# Patient Record
Sex: Male | Born: 1965 | Race: White | Hispanic: No | Marital: Married | Smoking: Current every day smoker
Health system: Southern US, Community
[De-identification: ages and names within clinical notes are randomized; demographics above are authoritative.]

## PROBLEM LIST (undated history)

## (undated) DIAGNOSIS — F29 Unspecified psychosis not due to a substance or known physiological condition: Secondary | ICD-10-CM

## (undated) DIAGNOSIS — F419 Anxiety disorder, unspecified: Secondary | ICD-10-CM

## (undated) DIAGNOSIS — J45909 Unspecified asthma, uncomplicated: Secondary | ICD-10-CM

## (undated) HISTORY — PX: HERNIA REPAIR: SHX51

---

## 2008-04-09 ENCOUNTER — Emergency Department (HOSPITAL_COMMUNITY): Admission: EM | Admit: 2008-04-09 | Discharge: 2008-04-09 | Payer: Self-pay | Admitting: Emergency Medicine

## 2017-09-22 ENCOUNTER — Encounter: Payer: Self-pay | Admitting: Emergency Medicine

## 2017-09-22 ENCOUNTER — Observation Stay
Admission: EM | Admit: 2017-09-22 | Discharge: 2017-09-24 | Disposition: A | Discharge status: Home / Self Care | Payer: BLUE CROSS/BLUE SHIELD | Attending: Internal Medicine | Admitting: Internal Medicine

## 2017-09-22 ENCOUNTER — Emergency Department: Payer: BLUE CROSS/BLUE SHIELD

## 2017-09-22 DIAGNOSIS — Z8249 Family history of ischemic heart disease and other diseases of the circulatory system: Secondary | ICD-10-CM | POA: Diagnosis not present

## 2017-09-22 DIAGNOSIS — S0511XA Contusion of eyeball and orbital tissues, right eye, initial encounter: Secondary | ICD-10-CM

## 2017-09-22 DIAGNOSIS — F419 Anxiety disorder, unspecified: Secondary | ICD-10-CM | POA: Insufficient documentation

## 2017-09-22 DIAGNOSIS — R569 Unspecified convulsions: Principal | ICD-10-CM | POA: Insufficient documentation

## 2017-09-22 DIAGNOSIS — S01512A Laceration without foreign body of oral cavity, initial encounter: Secondary | ICD-10-CM | POA: Diagnosis not present

## 2017-09-22 DIAGNOSIS — R55 Syncope and collapse: Secondary | ICD-10-CM | POA: Diagnosis present

## 2017-09-22 DIAGNOSIS — W1839XA Other fall on same level, initial encounter: Secondary | ICD-10-CM | POA: Insufficient documentation

## 2017-09-22 DIAGNOSIS — E785 Hyperlipidemia, unspecified: Secondary | ICD-10-CM | POA: Diagnosis not present

## 2017-09-22 DIAGNOSIS — K219 Gastro-esophageal reflux disease without esophagitis: Secondary | ICD-10-CM | POA: Insufficient documentation

## 2017-09-22 DIAGNOSIS — F29 Unspecified psychosis not due to a substance or known physiological condition: Secondary | ICD-10-CM | POA: Insufficient documentation

## 2017-09-22 DIAGNOSIS — F329 Major depressive disorder, single episode, unspecified: Secondary | ICD-10-CM | POA: Diagnosis not present

## 2017-09-22 DIAGNOSIS — J45909 Unspecified asthma, uncomplicated: Secondary | ICD-10-CM | POA: Diagnosis not present

## 2017-09-22 DIAGNOSIS — F17290 Nicotine dependence, other tobacco product, uncomplicated: Secondary | ICD-10-CM | POA: Diagnosis not present

## 2017-09-22 HISTORY — DX: Unspecified psychosis not due to a substance or known physiological condition: F29

## 2017-09-22 HISTORY — DX: Anxiety disorder, unspecified: F41.9

## 2017-09-22 HISTORY — DX: Unspecified asthma, uncomplicated: J45.909

## 2017-09-22 LAB — BASIC METABOLIC PANEL
Anion gap: 14 (ref 5–15)
BUN: 7 mg/dL (ref 6–20)
CO2: 20 mmol/L — AB (ref 22–32)
Calcium: 8.5 mg/dL — ABNORMAL LOW (ref 8.9–10.3)
Chloride: 104 mmol/L (ref 101–111)
Creatinine, Ser: 1.04 mg/dL (ref 0.61–1.24)
GFR calc Af Amer: >60 mL/min (ref >60)
GFR calc non Af Amer: >60 mL/min (ref >60)
GLUCOSE: 78 mg/dL (ref 65–99)
POTASSIUM: 4.2 mmol/L (ref 3.5–5.1)
Sodium: 138 mmol/L (ref 135–145)

## 2017-09-22 LAB — URINALYSIS, COMPLETE (UACMP) WITH MICROSCOPIC
BACTERIA UA: NONE SEEN
Bilirubin Urine: NEGATIVE
Glucose, UA: NEGATIVE mg/dL
HGB URINE DIPSTICK: NEGATIVE
KETONES UR: NEGATIVE mg/dL
Leukocytes, UA: NEGATIVE
NITRITE: NEGATIVE
PROTEIN: NEGATIVE mg/dL
Specific Gravity, Urine: 1.006 (ref 1.005–1.030)
Squamous Epithelial / LPF: NONE SEEN
pH: 7 (ref 5.0–8.0)

## 2017-09-22 LAB — CBC
HEMATOCRIT: 43.8 % (ref 40.0–52.0)
HEMOGLOBIN: 15.2 g/dL (ref 13.0–18.0)
MCH: 30.5 pg (ref 26.0–34.0)
MCHC: 34.7 g/dL (ref 32.0–36.0)
MCV: 87.9 fL (ref 80.0–100.0)
Platelets: 243 10*3/uL (ref 150–440)
RBC: 4.98 MIL/uL (ref 4.40–5.90)
RDW: 13.4 % (ref 11.5–14.5)
WBC: 6.6 10*3/uL (ref 3.8–10.6)

## 2017-09-22 LAB — GLUCOSE, CAPILLARY: GLUCOSE-CAPILLARY: 70 mg/dL (ref 65–99)

## 2017-09-22 LAB — ETHANOL

## 2017-09-22 LAB — HEPATIC FUNCTION PANEL
ALBUMIN: 3.9 g/dL (ref 3.5–5.0)
ALT: 14 U/L — ABNORMAL LOW (ref 17–63)
AST: 36 U/L (ref 15–41)
Alkaline Phosphatase: 53 U/L (ref 38–126)
BILIRUBIN DIRECT: 0.2 mg/dL (ref 0.1–0.5)
BILIRUBIN INDIRECT: 0.6 mg/dL (ref 0.3–0.9)
BILIRUBIN TOTAL: 0.8 mg/dL (ref 0.3–1.2)
Total Protein: 6.8 g/dL (ref 6.5–8.1)

## 2017-09-22 LAB — URINE DRUG SCREEN, QUALITATIVE (ARMC ONLY)
AMPHETAMINES, UR SCREEN: NOT DETECTED
BARBITURATES, UR SCREEN: NOT DETECTED
BENZODIAZEPINE, UR SCRN: NOT DETECTED
CANNABINOID 50 NG, UR ~~LOC~~: NOT DETECTED
Cocaine Metabolite,Ur ~~LOC~~: NOT DETECTED
MDMA (Ecstasy)Ur Screen: NOT DETECTED
Methadone Scn, Ur: NOT DETECTED
Opiate, Ur Screen: NOT DETECTED
PHENCYCLIDINE (PCP) UR S: NOT DETECTED
Tricyclic, Ur Screen: NOT DETECTED

## 2017-09-22 LAB — TROPONIN I: Troponin I: <0.03 ng/mL (ref <0.03)

## 2017-09-22 MED ORDER — ONDANSETRON HCL 4 MG PO TABS: 4.0000 mg | ORAL_TABLET | Freq: Four times a day (QID) | ORAL | Status: DC | PRN

## 2017-09-22 MED ORDER — ACETAMINOPHEN 500 MG PO TABS
1000.0000 mg | ORAL_TABLET | Freq: Once | ORAL | Status: AC
  Administered 2017-09-22: 1000 mg via ORAL
  Filled 2017-09-22: qty 2

## 2017-09-22 MED ORDER — ATORVASTATIN CALCIUM 20 MG PO TABS
40.0000 mg | ORAL_TABLET | Freq: Every evening | ORAL | Status: DC
  Administered 2017-09-22 – 2017-09-23 (×2): 40 mg via ORAL
  Filled 2017-09-22 (×2): qty 2

## 2017-09-22 MED ORDER — HYDROXYZINE HCL 25 MG PO TABS
25.0000 mg | ORAL_TABLET | Freq: Four times a day (QID) | ORAL | Status: DC | PRN
  Filled 2017-09-22: qty 1

## 2017-09-22 MED ORDER — ONDANSETRON HCL 4 MG/2ML IJ SOLN: 4.0000 mg | Freq: Four times a day (QID) | INTRAMUSCULAR | Status: DC | PRN

## 2017-09-22 MED ORDER — ACETAMINOPHEN 325 MG PO TABS: 650.0000 mg | ORAL_TABLET | Freq: Four times a day (QID) | ORAL | Status: DC | PRN

## 2017-09-22 MED ORDER — SODIUM CHLORIDE 0.9 % IV BOLUS (SEPSIS)
1000.0000 mL | Freq: Once | INTRAVENOUS | Status: AC
  Administered 2017-09-22: 1000 mL via INTRAVENOUS

## 2017-09-22 MED ORDER — ACETAMINOPHEN 650 MG RE SUPP: 650.0000 mg | Freq: Four times a day (QID) | RECTAL | Status: DC | PRN

## 2017-09-22 MED ORDER — CITALOPRAM HYDROBROMIDE 20 MG PO TABS
30.0000 mg | ORAL_TABLET | Freq: Every day | ORAL | Status: DC
  Administered 2017-09-23 – 2017-09-24 (×2): 30 mg via ORAL
  Filled 2017-09-22 (×2): qty 2

## 2017-09-22 MED ORDER — QUETIAPINE FUMARATE 25 MG PO TABS
50.0000 mg | ORAL_TABLET | Freq: Every day | ORAL | Status: DC
  Administered 2017-09-23 – 2017-09-24 (×2): 50 mg via ORAL
  Filled 2017-09-22 (×2): qty 2

## 2017-09-22 MED ORDER — ENOXAPARIN SODIUM 40 MG/0.4ML ~~LOC~~ SOLN
40.0000 mg | SUBCUTANEOUS | Status: DC
  Filled 2017-09-22: qty 0.4

## 2017-09-22 MED ORDER — QUETIAPINE FUMARATE ER 200 MG PO TB24
400.0000 mg | ORAL_TABLET | Freq: Every day | ORAL | Status: DC
  Administered 2017-09-22 – 2017-09-23 (×2): 400 mg via ORAL
  Filled 2017-09-22: qty 1
  Filled 2017-09-22 (×3): qty 2

## 2017-09-22 MED ORDER — GABAPENTIN 600 MG PO TABS
900.0000 mg | ORAL_TABLET | Freq: Three times a day (TID) | ORAL | Status: DC
  Administered 2017-09-22 – 2017-09-24 (×5): 900 mg via ORAL
  Filled 2017-09-22 (×5): qty 2

## 2017-09-22 MED ORDER — FAMOTIDINE 20 MG PO TABS
20.0000 mg | ORAL_TABLET | Freq: Every day | ORAL | Status: DC
  Administered 2017-09-23 – 2017-09-24 (×2): 20 mg via ORAL
  Filled 2017-09-22 (×2): qty 1

## 2017-09-22 NOTE — ED Provider Notes (Signed)
[facility] Emergency Department Provider Note  ____________________________________________  Time seen: Approximately 3:49 PM  I have reviewed the triage vital signs and the nursing notes.   HISTORY  Chief Complaint Loss of Consciousness    HPI Scott West is a 51 y.o. male presenting with recurrent syncope.  The patient states that the only thing he had for breakfast was coffee and candy corn and he began to feel "shaky."  He was trying to order at [facility] when he collapsed and had tonic-clonic seizure-like activity, described by an off-duty paramedic who was at the scene.  He was postictal, with facial trauma and a right tongue laceration.  No urinary or fecal incontinence.  His blood sugar was normal, and his vital signs were stable.  He reports a similar episode 2 days ago, which he also attributed to not eating enough.  He has no history of seizures.  With both episodes, he had a postictal headache.  He denies any recent illness including cough or cold symptoms, nausea vomiting or diarrhea, fevers or chills.  No trauma prior to seizing.  He has had a recent change in his medications due to significant anxiety, including an increase in his Celexa.  The patient describes a remote history of alcoholism but has not had any alcohol since 2008; he denies any current illicit drug abuse.  Right now, the patient is having right shoulder discomfort.   Past Medical History:  Diagnosis Date  . Anxiety   . Asthma   . Psychosis (HCC)     There are no active problems to display for this patient.   Past Surgical History:  Procedure Laterality Date  . HERNIA REPAIR        Allergies Brethine [terbutaline] and Dimetapp decongestant [pseudoephedrine]  History reviewed. No pertinent family history.  Social History Social History  Substance Use Topics  . Smoking status: Current Every Day Smoker    Types: E-cigarettes  . Smokeless tobacco: Never Used  . Alcohol use  Yes     Comment: former 2007    Review of Systems Constitutional: No fever/chills.  No lightheadedness or dizziness.  Positive syncope with tonic-clonic seizure activity. Eyes: No visual changes.  No blurred or double vision.  Positive trauma to the right orbit. ENT: No sore throat. No congestion or rhinorrhea.  No dental injury or malocclusion.  Positive laceration to the right tongue. Cardiovascular: Denies chest pain. Denies palpitations. Respiratory: Denies shortness of breath.  No cough. Gastrointestinal: No abdominal pain.  No nausea, no vomiting.  No diarrhea.  No constipation. Genitourinary: Negative for dysuria. Musculoskeletal: Negative for back pain. Skin: Negative for rash. Neurological: Positive for headaches. No focal numbness, tingling or weakness.  Positive syncope with tonic-clonic activity.   Psychiatric:No current alcohol or drug abuse. Endocrine:Normal blood sugar.    ____________________________________________   PHYSICAL EXAM:  VITAL SIGNS: ED Triage Vitals  Enc Vitals Group     BP 09/22/17 1414 (!) 141/92     Pulse Rate 09/22/17 1414 93     Resp 09/22/17 1414 18     Temp 09/22/17 1414 98.1 F (36.7 C)     Temp src --      SpO2 09/22/17 1414 93 %     Weight 09/22/17 1414 215 lb (97.5 kg)     Height 09/22/17 1414 5\' 9"  (1.753 m)     Head Circumference --      Peak Flow --      Pain Score 09/22/17 1413 3  Pain Loc --      Pain Edu? --      Excl. in GC? --     Constitutional: Patient is alert and oriented on my examination.  His GCS is 15.  He is comfortable appearing. Eyes: Conjunctivae are normal.  EOMI. PERRLA.  Positive raccoon eye on the right.  No evidence of entrapment.  No scleral icterus. Head: Atraumatic.  No battle sign. Nose: No congestion/rhinnorhea. Mouth/Throat: Mucous membranes are moist.  No dental injury or malocclusion.  The patient does have a superficial abrasion to the right aspect of the tongue that is hemostatic at this  time. Neck: No stridor.  Supple.  No midline C-spine tenderness to palpation, step-offs or deformities.  Full range of motion without pain.  No meningismus. Cardiovascular: Normal rate, regular rhythm. No murmurs, rubs or gallops.  Respiratory: Normal respiratory effort.  No accessory muscle use or retractions. Lungs CTAB.  No wheezes, rales or ronchi. Gastrointestinal: Obese.  Soft, nontender and nondistended.  No guarding or rebound.  No peritoneal signs. Musculoskeletal: Pelvis is stable.  The patient has full range of motion of the bilateral wrists, elbows, and shoulders although he has some mild discomfort with movement of the right shoulder.  He has full range of motion without pain of the bilateral hips, knees, and ankles.  He has normal DP and PT pulses.  No LE edema. No ttp in the calves or palpable cords.  Negative Homan's sign. Neurologic:  A&Ox3.  Speech is clear.  Face and smile are symmetric.  EOMI.  Moves all extremities well. Skin:  Skin is warm, dry. No rash noted.  Mild linear abrasion that is superficial and approximately 18 inches long on the left back.  Superficial abrasion to the right hand that is 1 x 1 cm; local area of scabbing right next to that. Psychiatric: Mood and affect are normal. Speech and behavior are normal.  Normal judgement.  ____________________________________________   LABS (all labs ordered are listed, but only abnormal results are displayed)  Labs Reviewed  BASIC METABOLIC PANEL - Abnormal; Notable for the following:       Result Value   CO2 20 (*)    Calcium 8.5 (*)    All other components within normal limits  URINALYSIS, COMPLETE (UACMP) WITH MICROSCOPIC - Abnormal; Notable for the following:    Color, Urine STRAW (*)    APPearance CLEAR (*)    All other components within normal limits  HEPATIC FUNCTION PANEL - Abnormal; Notable for the following:    ALT 14 (*)    All other components within normal limits  CBC  GLUCOSE, CAPILLARY  URINE DRUG  SCREEN, QUALITATIVE ([facility] ONLY)  ETHANOL  TROPONIN I  CBG MONITORING, ED   ____________________________________________  EKG  ED ECG REPORT I, Rockne Menghini, the attending physician, personally viewed and interpreted this ECG.   Date: 09/22/2017  EKG Time: 1417  Rate: 93  Rhythm: normal sinus rhythm  Axis: normal  Intervals:none  ST&T Change: No STEMI.  No evidence of prolonged QTC.  No evidence of hypertrophy. No Brugada syndrome.  ____________________________________________  RADIOLOGY  Ct Head Wo Contrast  Result Date: 09/22/2017 CLINICAL DATA:  Seizure. EXAM: CT HEAD WITHOUT CONTRAST CT MAXILLOFACIAL WITHOUT CONTRAST TECHNIQUE: Multidetector CT imaging of the head and maxillofacial structures were performed using the standard protocol without intravenous contrast. Multiplanar CT image reconstructions of the maxillofacial structures were also generated. COMPARISON:  None. FINDINGS: CT HEAD FINDINGS Brain: No evidence of acute infarction, hemorrhage, hydrocephalus,  extra-axial collection or mass lesion/mass effect. Vascular: No hyperdense vessel or unexpected calcification. Skull: Normal. Negative for fracture or focal lesion. Other: None. CT MAXILLOFACIAL FINDINGS Osseous: No fracture or mandibular dislocation. No destructive process. Orbits: Negative. No traumatic or inflammatory finding. Sinuses: Clear. Soft tissues: Negative. IMPRESSION: 1. No acute intracranial abnormality. 2. No facial bone fractures. Electronically Signed   By: Gerome Sam III M.D   On: 09/22/2017 16:32   Ct Maxillofacial Wo Contrast  Result Date: 09/22/2017 CLINICAL DATA:  Seizure. EXAM: CT HEAD WITHOUT CONTRAST CT MAXILLOFACIAL WITHOUT CONTRAST TECHNIQUE: Multidetector CT imaging of the head and maxillofacial structures were performed using the standard protocol without intravenous contrast. Multiplanar CT image reconstructions of the maxillofacial structures were also generated. COMPARISON:   None. FINDINGS: CT HEAD FINDINGS Brain: No evidence of acute infarction, hemorrhage, hydrocephalus, extra-axial collection or mass lesion/mass effect. Vascular: No hyperdense vessel or unexpected calcification. Skull: Normal. Negative for fracture or focal lesion. Other: None. CT MAXILLOFACIAL FINDINGS Osseous: No fracture or mandibular dislocation. No destructive process. Orbits: Negative. No traumatic or inflammatory finding. Sinuses: Clear. Soft tissues: Negative. IMPRESSION: 1. No acute intracranial abnormality. 2. No facial bone fractures. Electronically Signed   By: Gerome Sam III M.D   On: 09/22/2017 16:32    ____________________________________________   PROCEDURES  Procedure(s) performed: None  Procedures  Critical Care performed: No ____________________________________________   INITIAL IMPRESSION / ASSESSMENT AND PLAN / ED COURSE  Pertinent labs & imaging results that were available during my care of the patient were reviewed by me and considered in my medical decision making (see chart for details).  51 y.o. male with remote alcohol and drug abuse presenting with 2 episodes of syncope versus seizure-like activity.  Overall, the patient is hemodynamically stable.  He has no focal neurologic deficits on examination, however I will get a CT scan to evaluate for intracranial mass or bleeding.  I would also be concerned about an electrolyte abnormality.  A primary seizure disorder is less likely at this age but is also possible.  The patient states he has not had any alcohol since 2008 and has not been using any illicit substances, but a toxicological reason for his seizure is also possible.  Hypoglycemia is unlikely given that the patient has a normal blood sugar.  Intermittent arrhythmia is also a possibility.  Plan CT, chest x-ray, laboratory studies and reevaluation.  The patient about my concerns with recurrent syncope resulting in significant injury, twice over the last  several days.  He states that he is willing to undergo ED evaluation but would refuse admission regardless of my findings.  ----------------------------------------- 7:04 PM on 09/22/2017 -----------------------------------------  The patient's workup in the emergency department has been reassuring.  He has remained hemodynamically stable.  CT scan does not show any acute intracranial process, nor any facial bony trauma.  His laboratory studies are also reassuring.  I have spoken to the patient again about my concerns about this recurrent syncope and his risk of being injured from his syncope, having lifelong disability or even death, and he adamantly refuses to be admitted to the hospital for echocardiogram or further evaluation.  He states he has an appointment to see his primary care physician.  His mother is at the bedside and understands my concerns, but has also been unable to convince the patient to stay.  At this time, the patient will leave AGAINST MEDICAL ADVICE but he understands that he can return at any time.  ____________________________________________  FINAL CLINICAL IMPRESSION(S) /  ED DIAGNOSES  Final diagnoses:  Syncope, unspecified syncope type  Contusion of right orbit, initial encounter  Laceration of tongue, initial encounter         NEW MEDICATIONS STARTED DURING THIS VISIT:  New Prescriptions   No medications on file      Rockne MenghiniNorman, Anne-Caroline, MD 09/22/17 1905

## 2017-09-22 NOTE — ED Notes (Signed)
Had syncopal episode at work last week as well.  states he believes it is from the candy corn he ate for both episodes. Denies hx of seizures.

## 2017-09-22 NOTE — H&P (Signed)
[facility] - [place] [facility]   PATIENT NAME: Scott West    MR#:  [id number]  DATE OF BIRTH:  [birth year]  DATE OF ADMISSION:  09/22/2017  PRIMARY CARE PHYSICIAN: Patient, No Pcp Per   REQUESTING/REFERRING PHYSICIAN: Dr. Sharma Covert  CHIEF COMPLAINT:   Chief Complaint  Patient presents with  . Loss of Consciousness    HISTORY OF PRESENT ILLNESS:  Scott West  is a 51 y.o. male with a known history of anxiety, psychosis, hyperlipidemia who presents to the hospital after a suspected syncopal/seizure episode. Patient says he woke up this morning and was feeling fine ate some candy corn for breakfast and was then going to drive to [place] to go visit his mom. He feels like his blood sugar was low so he stopped that a gas station and attempted to order some food and then collapsed to the floor and was noticed by people there to have some seizure type activity. He cannot remember anything other than the fact that he came to the ER. Patient denies any prodromal symptoms prior to his syncope/seizure type activity. Patient has never had any previous seizures. He does have a psychiatric history is on multiple psychiatric meds and recently his Celexa dose was increased from 20 to 30 mg. His gabapentin dose has also been increased from 300 mg 3 times a day just 900 mg 3 times a day.patient presented to the ER underwent an extensive workup which showed a negative CT head, negative metabolic workup. Hospitalist services were contacted for admission for workup of his syncope/seizure type activity.  PAST MEDICAL HISTORY:   Past Medical History:  Diagnosis Date  . Anxiety   . Asthma   . Psychosis (HCC)     PAST SURGICAL HISTORY:   Past Surgical History:  Procedure Laterality Date  . HERNIA REPAIR      SOCIAL HISTORY:   Social History  Substance Use Topics  . Smoking status: Current Every Day Smoker    Types: E-cigarettes  . Smokeless tobacco: Never Used  . Alcohol use  Yes     Comment: former 2007    FAMILY HISTORY:   Family History  Problem Relation Age of Onset  . Hypertension Mother   . Heart disease Father     DRUG ALLERGIES:   Allergies  Allergen Reactions  . Brethine [Terbutaline]   . Dimetapp Decongestant [Pseudoephedrine]     REVIEW OF SYSTEMS:   Review of Systems  Constitutional: Negative for chills and fever.  HENT: Negative for congestion and tinnitus.   Eyes: Negative for blurred vision and double vision.  Respiratory: Negative for cough, shortness of breath and wheezing.   Cardiovascular: Negative for chest pain, orthopnea and PND.  Gastrointestinal: Negative for abdominal pain, diarrhea, nausea and vomiting.  Genitourinary: Negative for dysuria and hematuria.  Neurological: Negative for dizziness, sensory change and focal weakness.  All other systems reviewed and are negative.   MEDICATIONS AT HOME:   Prior to Admission medications   Medication Sig Start Date End Date Taking? Authorizing Provider  atorvastatin (LIPITOR) 40 MG tablet Take 40 mg by mouth daily.   Yes [provider]  citalopram (CELEXA) 20 MG tablet Take 30 mg by mouth daily.   Yes [provider]  gabapentin (NEURONTIN) 600 MG tablet Take 900 mg by mouth 3 (three) times daily.   Yes [provider]  hydrOXYzine (ATARAX/VISTARIL) 25 MG tablet Take 25 mg by mouth every 6 (six) hours as needed.   Yes [provider]  QUEtiapine (SEROQUEL XR) 400 MG 24 hr tablet Take 400 mg by mouth at bedtime.   Yes [provider]  QUEtiapine (SEROQUEL) 50 MG tablet Take 50 mg by mouth daily.   Yes [provider]  ranitidine (ZANTAC) 150 MG tablet Take 150 mg by mouth as needed for heartburn.   Yes [provider]      VITAL SIGNS:  Blood pressure (!) 151/96, pulse 71, temperature 98.1 F (36.7 C), resp. rate 19, height 5\' 9"  (1.753 m), weight 97.5 kg (215 lb), SpO2 96 %.  PHYSICAL EXAMINATION:  Physical  Exam  GENERAL:  51 y.o.-year-old patient lying in the bed in no acute distress.  EYES: Pupils equal, round, reactive to light and accommodation. No scleral icterus. Extraocular muscles intact.  HEENT: Head atraumatic, normocephalic. Oropharynx and nasopharynx clear. No oropharyngeal erythema, moist oral mucosa. + tongue bite on left lateral side of tongue NECK:  Supple, no jugular venous distention.  No thyroid enlargement, no tenderness.  LUNGS: Normal breath sounds bilaterally, no wheezing, rales, rhonchi. No use of accessory muscles of respiration.  CARDIOVASCULAR: S1, S2 RRR. No murmurs, rubs, gallops, clicks.  ABDOMEN: Soft, nontender, nondistended. Bowel sounds present. No organomegaly or mass.  EXTREMITIES: No pedal edema, cyanosis, or clubbing. + 2 pedal & radial pulses b/l.   NEUROLOGIC: Cranial nerves II through XII are intact. No focal Motor or sensory deficits appreciated b/l PSYCHIATRIC: The patient is alert and oriented x 3.   SKIN: No obvious rash, lesion, or ulcer.   LABORATORY PANEL:   CBC  Recent Labs Lab 09/22/17 1415  WBC 6.6  HGB 15.2  HCT 43.8  PLT 243   ------------------------------------------------------------------------------------------------------------------  Chemistries   Recent Labs Lab 09/22/17 1415 09/22/17 1549  NA 138  --   K 4.2  --   CL 104  --   CO2 20*  --   GLUCOSE 78  --   BUN 7  --   CREATININE 1.04  --   CALCIUM 8.5*  --   AST  --  36  ALT  --  14*  ALKPHOS  --  53  BILITOT  --  0.8   ------------------------------------------------------------------------------------------------------------------  Cardiac Enzymes  Recent Labs Lab 09/22/17 1549  TROPONINI <0.03   ------------------------------------------------------------------------------------------------------------------  RADIOLOGY:  Ct Head Wo Contrast  Result Date: 09/22/2017 CLINICAL DATA:  Seizure. EXAM: CT HEAD WITHOUT CONTRAST CT MAXILLOFACIAL  WITHOUT CONTRAST TECHNIQUE: Multidetector CT imaging of the head and maxillofacial structures were performed using the standard protocol without intravenous contrast. Multiplanar CT image reconstructions of the maxillofacial structures were also generated. COMPARISON:  None. FINDINGS: CT HEAD FINDINGS Brain: No evidence of acute infarction, hemorrhage, hydrocephalus, extra-axial collection or mass lesion/mass effect. Vascular: No hyperdense vessel or unexpected calcification. Skull: Normal. Negative for fracture or focal lesion. Other: None. CT MAXILLOFACIAL FINDINGS Osseous: No fracture or mandibular dislocation. No destructive process. Orbits: Negative. No traumatic or inflammatory finding. Sinuses: Clear. Soft tissues: Negative. IMPRESSION: 1. No acute intracranial abnormality. 2. No facial bone fractures. Electronically Signed   By: Gerome Samavid  Williams III M.D   On: 09/22/2017 16:32   Ct Maxillofacial Wo Contrast  Result Date: 09/22/2017 CLINICAL DATA:  Seizure. EXAM: CT HEAD WITHOUT CONTRAST CT MAXILLOFACIAL WITHOUT CONTRAST TECHNIQUE: Multidetector CT imaging of the head and maxillofacial structures were performed using the standard protocol without intravenous contrast. Multiplanar CT image reconstructions of the maxillofacial structures were also generated. COMPARISON:  None. FINDINGS: CT HEAD FINDINGS Brain: No evidence of acute infarction,  hemorrhage, hydrocephalus, extra-axial collection or mass lesion/mass effect. Vascular: No hyperdense vessel or unexpected calcification. Skull: Normal. Negative for fracture or focal lesion. Other: None. CT MAXILLOFACIAL FINDINGS Osseous: No fracture or mandibular dislocation. No destructive process. Orbits: Negative. No traumatic or inflammatory finding. Sinuses: Clear. Soft tissues: Negative. IMPRESSION: 1. No acute intracranial abnormality. 2. No facial bone fractures. Electronically Signed   By: Gerome Sam III M.D   On: 09/22/2017 16:32     IMPRESSION AND  PLAN:   51 year old male with past medical history of anxiety, psychosis, hyperlipidemia who presents to the hospital due tosyncope/seizure type activity.  1. Syncope with collapse/seizures-unclear at this was a syncope with collapse versus possible seizure type activity or postictal state. Patient cannot recall any of the events or any prodromal symptoms. He was apparently found at a [employer] station after having a syncopal episode and having some questionable seizure activity. He was in a postictal state but currently is stable. -He has no previous history of seizures. No previous cardiac history. -A CT head is negative for acute pathology. I will observe him on telemetry, check a two-dimensional echocardiogram, get a carotid duplex. -I will also get a neurology consult for suspected seizures. Patient is on multiple psychotropic meds which could possibly lower seizure threshold.  2. Anxiety/depression-continue Celexa, Seroquel.  3. Hyperlipidemia-continue atorvastatin. Next  4. GERD-continue Pepcid.    All the records are reviewed and case discussed with ED provider. Management plans discussed with the patient, family and they are in agreement.  CODE STATUS: Full code  TOTAL TIME TAKING CARE OF THIS PATIENT: 40 minutes.    Houston Siren M.D on 09/22/2017 at 7:55 PM  Between 7am to 6pm - Pager - [phone]  After 6pm go to [email or web address] - password [email or web address]  [facility]  Office  [phone]  CC: Primary care physician; Patient, No Pcp Per

## 2017-09-22 NOTE — ED Notes (Signed)
Pt antsy - doesn't want to stay. States he is fine. Willing to have ct and tests. Advised if he is discharged he can not drive until they know why he is passing out.

## 2017-09-22 NOTE — ED Triage Notes (Signed)
[place] and was ordering food and had a syncope episode in which he had seizure like activity, bit his tongue and was post tictal afterwards. Is on seroquel, atarax, and gabapentin.

## 2017-09-22 NOTE — ED Notes (Signed)
Pt agreed to stay and is no longer signing out ama. Dr made aware.

## 2017-09-22 NOTE — ED Notes (Signed)
Pt given a bagged lunch.   

## 2017-09-23 ENCOUNTER — Observation Stay: Admit: 2017-09-23 | Payer: BLUE CROSS/BLUE SHIELD

## 2017-09-23 ENCOUNTER — Observation Stay: Payer: BLUE CROSS/BLUE SHIELD

## 2017-09-23 ENCOUNTER — Observation Stay (HOSPITAL_BASED_OUTPATIENT_CLINIC_OR_DEPARTMENT_OTHER)
Admit: 2017-09-23 | Discharge: 2017-09-23 | Disposition: A | Discharge status: Home / Self Care | Payer: BLUE CROSS/BLUE SHIELD | Attending: Specialist | Admitting: Specialist

## 2017-09-23 DIAGNOSIS — R55 Syncope and collapse: Secondary | ICD-10-CM | POA: Diagnosis not present

## 2017-09-23 DIAGNOSIS — R569 Unspecified convulsions: Secondary | ICD-10-CM

## 2017-09-23 LAB — ECHOCARDIOGRAM COMPLETE
HEIGHTINCHES: 69 in
WEIGHTICAEL: 3440 [oz_av]

## 2017-09-23 LAB — BASIC METABOLIC PANEL
ANION GAP: 8 (ref 5–15)
BUN: 7 mg/dL (ref 6–20)
CO2: 24 mmol/L (ref 22–32)
Calcium: 8.4 mg/dL — ABNORMAL LOW (ref 8.9–10.3)
Chloride: 107 mmol/L (ref 101–111)
Creatinine, Ser: 1.05 mg/dL (ref 0.61–1.24)
GFR calc Af Amer: >60 mL/min (ref >60)
Glucose, Bld: 95 mg/dL (ref 65–99)
POTASSIUM: 3.7 mmol/L (ref 3.5–5.1)
SODIUM: 139 mmol/L (ref 135–145)

## 2017-09-23 LAB — CBC
HEMATOCRIT: 40.9 % (ref 40.0–52.0)
HEMOGLOBIN: 14.1 g/dL (ref 13.0–18.0)
MCH: 30.5 pg (ref 26.0–34.0)
MCHC: 34.5 g/dL (ref 32.0–36.0)
MCV: 88.3 fL (ref 80.0–100.0)
Platelets: 234 10*3/uL (ref 150–440)
RBC: 4.64 MIL/uL (ref 4.40–5.90)
RDW: 13.3 % (ref 11.5–14.5)
WBC: 6 10*3/uL (ref 3.8–10.6)

## 2017-09-23 MED ORDER — GADOBENATE DIMEGLUMINE 529 MG/ML IV SOLN
20.0000 mL | Freq: Once | INTRAVENOUS | Status: AC | PRN
  Administered 2017-09-23: 16:00:00 20 mL via INTRAVENOUS

## 2017-09-23 NOTE — Progress Notes (Signed)
[employer] [facility] - [place] [facility]                                                                                                                                                                                  Patient Demographics   Scott West, is a [age] y.o. male, DOB - 1966/05/06, ZOX:[id number]  Admit date - 09/22/2017   Admitting Physician Houston SirenVivek J Sainani, MD  Outpatient Primary MD for the patient is Patient, No Pcp Per   LOS - 0  Subjective: Pt  Had no further seizure,very anxious to go home    Review of Systems:   CONSTITUTIONAL: No documented fever. No fatigue, weakness. No weight gain, no weight loss.  EYES: No blurry or double vision.  ENT: No tinnitus. No postnasal drip. No redness of the oropharynx.  RESPIRATORY: No cough, no wheeze, no hemoptysis. No dyspnea.  CARDIOVASCULAR: No chest pain. No orthopnea. No palpitations. No syncope.  GASTROINTESTINAL: No nausea, no vomiting or diarrhea. No abdominal pain. No melena or hematochezia.  GENITOURINARY: No dysuria or hematuria.  ENDOCRINE: No polyuria or nocturia. No heat or cold intolerance.  HEMATOLOGY: No anemia. No bruising. No bleeding.  INTEGUMENTARY: No rashes. No lesions.  MUSCULOSKELETAL: No arthritis. No swelling. No gout.  NEUROLOGIC: No numbness, tingling, or ataxia. No seizure-type activity.  PSYCHIATRIC: No anxiety. No insomnia. No ADD.    Vitals:   Vitals:   09/22/17 2000 09/22/17 2058 09/23/17 0429 09/23/17 1327  BP: (!) 135/94 (!) 142/94 106/60 116/62  Pulse:  (!) 52 68 76  Resp: 15 18 18 18   Temp:  97.9 F (36.6 C) 97.7 F (36.5 C) 98.3 F (36.8 C)  TempSrc:  Oral Oral Oral  SpO2:  98% 97% 98%  Weight:      Height:        Wt Readings from Last 3 Encounters:  09/22/17 215 lb (97.5 kg)     Intake/Output Summary (Last 24 hours) at 09/23/17 1344 Last data filed at 09/23/17 0800  Gross per 24 hour  Intake              480 ml  Output                0 ml  Net               480 ml    Physical Exam:   GENERAL: Pleasant-appearing in no apparent distress.  HEAD, EYES, EARS, NOSE AND THROAT: Atraumatic, normocephalic. Extraocular muscles are intact. Pupils equal and reactive to light. Sclerae anicteric. No conjunctival injection. No oro-pharyngeal erythema.  NECK: Supple. There is no jugular venous distention. No bruits, no lymphadenopathy, no thyromegaly.  HEART: Regular rate and rhythm,. No murmurs, no rubs, no clicks.  LUNGS: Clear to auscultation bilaterally. No rales or rhonchi. No wheezes.  ABDOMEN: Soft, flat, nontender, nondistended. Has good bowel sounds. No hepatosplenomegaly appreciated.  EXTREMITIES: No evidence of any cyanosis, clubbing, or peripheral edema.  +2 pedal and radial pulses bilaterally.  NEUROLOGIC: The patient is alert, awake, and oriented x3 with no focal motor or sensory deficits appreciated bilaterally.  SKIN: Moist and warm with no rashes appreciated.  Psych: Not anxious, depressed LN: No inguinal LN enlargement    Antibiotics   Anti-infectives    None      Medications   Scheduled Meds: . atorvastatin  40 mg Oral QPM  . citalopram  30 mg Oral Daily  . enoxaparin (LOVENOX) injection  40 mg Subcutaneous Q24H  . famotidine  20 mg Oral Daily  . gabapentin  900 mg Oral TID  . QUEtiapine  400 mg Oral QHS  . QUEtiapine  50 mg Oral Daily   Continuous Infusions: PRN Meds:.acetaminophen **OR** acetaminophen, hydrOXYzine, ondansetron **OR** ondansetron (ZOFRAN) IV   Data Review:   Micro Results No results found for this or any previous visit (from the past 240 hour(s)).  Radiology Reports Ct Head Wo Contrast  Result Date: 09/22/2017 CLINICAL DATA:  Seizure. EXAM: CT HEAD WITHOUT CONTRAST CT MAXILLOFACIAL WITHOUT CONTRAST TECHNIQUE: Multidetector CT imaging of the head and maxillofacial structures were performed using the standard protocol without intravenous contrast. Multiplanar CT image reconstructions of the  maxillofacial structures were also generated. COMPARISON:  None. FINDINGS: CT HEAD FINDINGS Brain: No evidence of acute infarction, hemorrhage, hydrocephalus, extra-axial collection or mass lesion/mass effect. Vascular: No hyperdense vessel or unexpected calcification. Skull: Normal. Negative for fracture or focal lesion. Other: None. CT MAXILLOFACIAL FINDINGS Osseous: No fracture or mandibular dislocation. No destructive process. Orbits: Negative. No traumatic or inflammatory finding. Sinuses: Clear. Soft tissues: Negative. IMPRESSION: 1. No acute intracranial abnormality. 2. No facial bone fractures. Electronically Signed   By: Gerome Sam III M.D   On: 09/22/2017 16:32   US Carotid Bilateral  Result Date: 09/23/2017 CLINICAL DATA:  Syncope, hyperlipidemia EXAM: BILATERAL CAROTID DUPLEX ULTRASOUND TECHNIQUE: Wallace Cullens scale imaging, color Doppler and duplex ultrasound were performed of bilateral carotid and vertebral arteries in the neck. COMPARISON:  None available FINDINGS: Criteria: Quantification of carotid stenosis is based on velocity parameters that correlate the residual internal carotid diameter with NASCET-based stenosis levels, using the diameter of the distal internal carotid lumen as the denominator for stenosis measurement. The following velocity measurements were obtained: RIGHT ICA:  69/26 cm/sec CCA:  102/19 cm/sec SYSTOLIC ICA/CCA RATIO:  0.7 DIASTOLIC ICA/CCA RATIO:  1.4 ECA:  91 cm/sec LEFT ICA:  71/30 cm/sec CCA:  92/23 cm/sec SYSTOLIC ICA/CCA RATIO:  0.8 DIASTOLIC ICA/CCA RATIO:  1.3 ECA:  91 cm/sec RIGHT CAROTID ARTERY: Minor intimal thickening and atherosclerotic change. No hemodynamically significant right ICA stenosis, velocity elevation, or turbulent flow. Degree of narrowing less than 50%. RIGHT VERTEBRAL ARTERY:  Antegrade LEFT CAROTID ARTERY: Similar scattered minor intimal thickening and atherosclerosis. No hemodynamically significant left ICA stenosis, velocity elevation, or  turbulent flow. LEFT VERTEBRAL ARTERY:  Antegrade IMPRESSION: Minor carotid intimal thickening and atherosclerosis. No hemodynamically significant ICA stenosis. Degree of narrowing less than 50% bilaterally by ultrasound criteria. Patent antegrade vertebral flow bilaterally Electronically Signed   By: Judie Petit.  Shick M.D.   On: 09/23/2017 09:58   Ct Maxillofacial Wo Contrast  Result Date: 09/22/2017 CLINICAL DATA:  Seizure. EXAM: CT HEAD WITHOUT CONTRAST CT  MAXILLOFACIAL WITHOUT CONTRAST TECHNIQUE: Multidetector CT imaging of the head and maxillofacial structures were performed using the standard protocol without intravenous contrast. Multiplanar CT image reconstructions of the maxillofacial structures were also generated. COMPARISON:  None. FINDINGS: CT HEAD FINDINGS Brain: No evidence of acute infarction, hemorrhage, hydrocephalus, extra-axial collection or mass lesion/mass effect. Vascular: No hyperdense vessel or unexpected calcification. Skull: Normal. Negative for fracture or focal lesion. Other: None. CT MAXILLOFACIAL FINDINGS Osseous: No fracture or mandibular dislocation. No destructive process. Orbits: Negative. No traumatic or inflammatory finding. Sinuses: Clear. Soft tissues: Negative. IMPRESSION: 1. No acute intracranial abnormality. 2. No facial bone fractures. Electronically Signed   By: Gerome Sam III M.D   On: 09/22/2017 16:32     CBC  Recent Labs Lab 09/22/17 1415 09/23/17 0519  WBC 6.6 6.0  HGB 15.2 14.1  HCT 43.8 40.9  PLT 243 234  MCV 87.9 88.3  MCH 30.5 30.5  MCHC 34.7 34.5  RDW 13.4 13.3    Chemistries   Recent Labs Lab 09/22/17 1415 09/22/17 1549 09/23/17 0519  NA 138  --  139  K 4.2  --  3.7  CL 104  --  107  CO2 20*  --  24  GLUCOSE 78  --  95  BUN 7  --  7  CREATININE 1.04  --  1.05  CALCIUM 8.5*  --  8.4*  AST  --  36  --   ALT  --  14*  --   ALKPHOS  --  53  --   BILITOT  --  0.8  --     ------------------------------------------------------------------------------------------------------------------ estimated creatinine clearance is 95.8 mL/min (by C-G formula based on SCr of 1.05 mg/dL). ------------------------------------------------------------------------------------------------------------------ No results for input(s): HGBA1C in the last 72 hours. ------------------------------------------------------------------------------------------------------------------ No results for input(s): CHOL, HDL, LDLCALC, TRIG, CHOLHDL, LDLDIRECT in the last 72 hours. ------------------------------------------------------------------------------------------------------------------ No results for input(s): TSH, T4TOTAL, T3FREE, THYROIDAB in the last 72 hours.  Invalid input(s): FREET3 ------------------------------------------------------------------------------------------------------------------ No results for input(s): VITAMINB12, FOLATE, FERRITIN, TIBC, IRON, RETICCTPCT in the last 72 hours.  Coagulation profile No results for input(s): INR, PROTIME in the last 168 hours.  No results for input(s): DDIMER in the last 72 hours.  Cardiac Enzymes  Recent Labs Lab 09/22/17 1549  TROPONINI <0.03   ------------------------------------------------------------------------------------------------------------------ Invalid input(s): POCBNP    Assessment & Plan   51 year old male with past medical history of anxiety, psychosis, hyperlipidemia who presents to the hospital due tosyncope/seizure type activity.  1. Seizure- possibly related to his psychiatry medications, neuro recommends MR of the brain, as well as EEG Echo pending for possible  Syncope  2. Anxiety/depression-continue Celexa, Seroquel.  3. Hyperlipidemia-continue atorvastatin.   4. GERD-continue Pepcid.     Code Status Orders        Start     Ordered   09/22/17 2038  Full code  Continuous      09/22/17 2037    Code Status History    Date Active Date Inactive Code Status Order ID Comments User Context   This patient has a current code status but no historical code status.           Consults  neuro  DVT Prophylaxis  Lovenox   Lab Results  Component Value Date   PLT 234 09/23/2017     Time Spent in minutes    Greater than 50% of time spent in care coordination and counseling patient regarding the condition and plan of care.   Auburn Bilberry M.D on  09/23/2017 at 1:44 PM  Between 7am to 6pm - Pager - [phone]  After 6pm go to [email or web address] - password [email or web address] [facility]  [facility]   Office  [phone]

## 2017-09-23 NOTE — Progress Notes (Signed)
*  PRELIMINARY RESULTS* Echocardiogram 2D Echocardiogram has been performed.  Garrel Ridgelikeshia S Shayra Anton 09/23/2017, 10:52 AM

## 2017-09-23 NOTE — Consult Note (Addendum)
Reason for Consult:Seizure Referring Physician: Allena KatzPatel  CC: Seizure  HPI: Scott West is an 51 y.o. male with a history of ppsychosis who reports that on Tuesday of last week he ate a bowl full of candy.  Not long after the patient became dizzy.  Went to get something to eat and at some point in time became unconscious.  The patient found himself with blood on the floor and on his face.  He is unclear exactly what happened.  The patient did not seek medical attention at that time but did eventually see his psychiatrist and his medications were adjusted.  On yesterday the patient again ate a lot of candy.  While at [employer] he again became dizzy and the next thing he remembers he was at the hospital.  He had tongue biting.  Patient reports that he is now back to baseline.    Past Medical History:  Diagnosis Date  . Anxiety   . Asthma   . Psychosis [facility](HCC)     Past Surgical History:  Procedure Laterality Date  . HERNIA REPAIR      Family History  Problem Relation Age of Onset  . Hypertension Mother   . Heart disease Father     Social History:  reports that he has been smoking E-cigarettes.  He has never used smokeless tobacco. He reports that he drinks alcohol. He reports that he uses drugs, including Marijuana.  Allergies  Allergen Reactions  . Brethine [Terbutaline]   . Dimetapp Decongestant [Pseudoephedrine]     Medications:  I have reviewed the patient's current medications. Prior to Admission:  Prescriptions Prior to Admission  Medication Sig Dispense Refill Last Dose  . atorvastatin (LIPITOR) 40 MG tablet Take 40 mg by mouth daily.   09/21/2017 at Unknown time  . citalopram (CELEXA) 20 MG tablet Take 30 mg by mouth daily.   09/22/2017 at Unknown time  . gabapentin (NEURONTIN) 600 MG tablet Take 900 mg by mouth 3 (three) times daily.   09/22/2017 at Unknown time  . hydrOXYzine (ATARAX/VISTARIL) 25 MG tablet Take 25 mg by mouth every 6 (six) hours as needed.   09/22/2017 at  Unknown time  . QUEtiapine (SEROQUEL XR) 400 MG 24 hr tablet Take 400 mg by mouth at bedtime.   09/21/2017 at Unknown time  . QUEtiapine (SEROQUEL) 50 MG tablet Take 50 mg by mouth daily.   09/22/2017 at Unknown time  . ranitidine (ZANTAC) 150 MG tablet Take 150 mg by mouth as needed for heartburn.   09/22/2017 at Unknown time   Scheduled: . atorvastatin  40 mg Oral QPM  . citalopram  30 mg Oral Daily  . enoxaparin (LOVENOX) injection  40 mg Subcutaneous Q24H  . famotidine  20 mg Oral Daily  . gabapentin  900 mg Oral TID  . QUEtiapine  400 mg Oral QHS  . QUEtiapine  50 mg Oral Daily    ROS: History obtained from the patient  General ROS: negative for - chills, fatigue, fever, night sweats, weight gain or weight loss Psychological ROS: depression Ophthalmic ROS: negative for - blurry vision, double vision, eye pain or loss of vision ENT ROS: negative for - epistaxis, nasal discharge, oral lesions, sore throat, tinnitus Allergy and Immunology ROS: negative for - hives or itchy/watery eyes Hematological and Lymphatic ROS: negative for - bleeding problems, bruising or swollen lymph nodes Endocrine ROS: negative for - galactorrhea, hair pattern changes, polydipsia/polyuria or temperature intolerance Respiratory ROS: negative for - cough, hemoptysis, shortness of breath or  wheezing Cardiovascular ROS: negative for - chest pain, dyspnea on exertion, edema or irregular heartbeat Gastrointestinal ROS: negative for - abdominal pain, diarrhea, hematemesis, nausea/vomiting or stool incontinence Genito-Urinary ROS: negative for - dysuria, hematuria, incontinence or urinary frequency/urgency Musculoskeletal ROS: negative for - joint swelling or muscular weakness Neurological ROS: as noted in HPI Dermatological ROS: negative for rash and skin lesion changes  Physical Examination: Blood pressure 106/60, pulse 68, temperature 97.7 F (36.5 C), temperature source Oral, resp. rate 18, height 5\' 9"   (1.753 m), weight 97.5 kg (215 lb), SpO2 97 %.  HEENT-  Normocephalic, no lesions, without obvious abnormality.  Normal external eye and conjunctiva.  Normal TM's bilaterally.  Normal auditory canals and external ears. Normal external nose, mucus membranes and septum.  Normal pharynx.  Tongue lacerations. Cardiovascular- S1, S2 normal, pulses palpable throughout   Lungs- chest clear, no wheezing, rales, normal symmetric air entry Abdomen- soft, non-tender; bowel sounds normal; no masses,  no organomegaly Extremities- no edema Lymph-no adenopathy palpable Musculoskeletal-no joint tenderness, deformity or swelling Skin-warm and dry, no hyperpigmentation, vitiligo, or suspicious lesions  Neurological Examination   Mental Status: Alert, oriented, thought content appropriate.  Speech fluent without evidence of aphasia.  Able to follow 3 step commands without difficulty. Cranial Nerves: II: Discs flat bilaterally; Visual fields grossly normal, pupils equal, round, reactive to light and accommodation III,IV, VI: ptosis not present, extra-ocular motions intact bilaterally V,VII: smile symmetric, facial light touch sensation normal bilaterally VIII: hearing normal bilaterally IX,X: gag reflex present XI: bilateral shoulder shrug XII: midline tongue extension Motor: Right : Upper extremity   5/5    Left:     Upper extremity   5/5  Lower extremity   5/5     Lower extremity   5/5 Tone and bulk:normal tone throughout; no atrophy noted Sensory: Pinprick and light touch intact throughout, bilaterally Deep Tendon Reflexes: 2+ and symmetric throughout Plantars: Right: downgoing   Left: downgoing Cerebellar: Normal finger-to-nose and normal heel-to-shin testing bilaterally Gait: normal gait and station    Laboratory Studies:   Basic Metabolic Panel:  Recent Labs Lab 09/22/17 1415 09/23/17 0519  NA 138 139  K 4.2 3.7  CL 104 107  CO2 20* 24  GLUCOSE 78 95  BUN 7 7  CREATININE 1.04 1.05   CALCIUM 8.5* 8.4*    Liver Function Tests:  Recent Labs Lab 09/22/17 1549  AST 36  ALT 14*  ALKPHOS 53  BILITOT 0.8  PROT 6.8  ALBUMIN 3.9   No results for input(s): LIPASE, AMYLASE in the last 168 hours. No results for input(s): AMMONIA in the last 168 hours.  CBC:  Recent Labs Lab 09/22/17 1415 09/23/17 0519  WBC 6.6 6.0  HGB 15.2 14.1  HCT 43.8 40.9  MCV 87.9 88.3  PLT 243 234    Cardiac Enzymes:  Recent Labs Lab 09/22/17 1549  TROPONINI <0.03    BNP: Invalid input(s): POCBNP  CBG:  Recent Labs Lab 09/22/17 1433  GLUCAP 70    Microbiology: No results found for this or any previous visit.  Coagulation Studies: No results for input(s): LABPROT, INR in the last 72 hours.  Urinalysis:  Recent Labs Lab 09/22/17 1419  COLORURINE STRAW*  LABSPEC 1.006  PHURINE 7.0  GLUCOSEU NEGATIVE  HGBUR NEGATIVE  BILIRUBINUR NEGATIVE  KETONESUR NEGATIVE  PROTEINUR NEGATIVE  NITRITE NEGATIVE  LEUKOCYTESUR NEGATIVE    Lipid Panel:  No results found for: CHOL, TRIG, HDL, CHOLHDL, VLDL, LDLCALC  HgbA1C: No results found for: HGBA1C  Urine Drug Screen:     Component Value Date/Time   LABOPIA NONE DETECTED 09/22/2017 1419   COCAINSCRNUR NONE DETECTED 09/22/2017 1419   LABBENZ NONE DETECTED 09/22/2017 1419   AMPHETMU NONE DETECTED 09/22/2017 1419   THCU NONE DETECTED 09/22/2017 1419   LABBARB NONE DETECTED 09/22/2017 1419    Alcohol Level:  Recent Labs Lab 09/22/17 1549  ETH <10    Other results: EKG: sinus rhythm at 93 bpm.  Imaging: Ct Head Wo Contrast  Result Date: 09/22/2017 CLINICAL DATA:  Seizure. EXAM: CT HEAD WITHOUT CONTRAST CT MAXILLOFACIAL WITHOUT CONTRAST TECHNIQUE: Multidetector CT imaging of the head and maxillofacial structures were performed using the standard protocol without intravenous contrast. Multiplanar CT image reconstructions of the maxillofacial structures were also generated. COMPARISON:  None. FINDINGS: CT HEAD  FINDINGS Brain: No evidence of acute infarction, hemorrhage, hydrocephalus, extra-axial collection or mass lesion/mass effect. Vascular: No hyperdense vessel or unexpected calcification. Skull: Normal. Negative for fracture or focal lesion. Other: None. CT MAXILLOFACIAL FINDINGS Osseous: No fracture or mandibular dislocation. No destructive process. Orbits: Negative. No traumatic or inflammatory finding. Sinuses: Clear. Soft tissues: Negative. IMPRESSION: 1. No acute intracranial abnormality. 2. No facial bone fractures. Electronically Signed   By: Gerome Sam III M.D   On: 09/22/2017 16:32   US Carotid Bilateral  Result Date: 09/23/2017 CLINICAL DATA:  Syncope, hyperlipidemia EXAM: BILATERAL CAROTID DUPLEX ULTRASOUND TECHNIQUE: Wallace Cullens scale imaging, color Doppler and duplex ultrasound were performed of bilateral carotid and vertebral arteries in the neck. COMPARISON:  None available FINDINGS: Criteria: Quantification of carotid stenosis is based on velocity parameters that correlate the residual internal carotid diameter with NASCET-based stenosis levels, using the diameter of the distal internal carotid lumen as the denominator for stenosis measurement. The following velocity measurements were obtained: RIGHT ICA:  69/26 cm/sec CCA:  102/19 cm/sec SYSTOLIC ICA/CCA RATIO:  0.7 DIASTOLIC ICA/CCA RATIO:  1.4 ECA:  91 cm/sec LEFT ICA:  71/30 cm/sec CCA:  92/23 cm/sec SYSTOLIC ICA/CCA RATIO:  0.8 DIASTOLIC ICA/CCA RATIO:  1.3 ECA:  91 cm/sec RIGHT CAROTID ARTERY: Minor intimal thickening and atherosclerotic change. No hemodynamically significant right ICA stenosis, velocity elevation, or turbulent flow. Degree of narrowing less than 50%. RIGHT VERTEBRAL ARTERY:  Antegrade LEFT CAROTID ARTERY: Similar scattered minor intimal thickening and atherosclerosis. No hemodynamically significant left ICA stenosis, velocity elevation, or turbulent flow. LEFT VERTEBRAL ARTERY:  Antegrade IMPRESSION: Minor carotid intimal  thickening and atherosclerosis. No hemodynamically significant ICA stenosis. Degree of narrowing less than 50% bilaterally by ultrasound criteria. Patent antegrade vertebral flow bilaterally Electronically Signed   By: Judie Petit.  Shick M.D.   On: 09/23/2017 09:58   Ct Maxillofacial Wo Contrast  Result Date: 09/22/2017 CLINICAL DATA:  Seizure. EXAM: CT HEAD WITHOUT CONTRAST CT MAXILLOFACIAL WITHOUT CONTRAST TECHNIQUE: Multidetector CT imaging of the head and maxillofacial structures were performed using the standard protocol without intravenous contrast. Multiplanar CT image reconstructions of the maxillofacial structures were also generated. COMPARISON:  None. FINDINGS: CT HEAD FINDINGS Brain: No evidence of acute infarction, hemorrhage, hydrocephalus, extra-axial collection or mass lesion/mass effect. Vascular: No hyperdense vessel or unexpected calcification. Skull: Normal. Negative for fracture or focal lesion. Other: None. CT MAXILLOFACIAL FINDINGS Osseous: No fracture or mandibular dislocation. No destructive process. Orbits: Negative. No traumatic or inflammatory finding. Sinuses: Clear. Soft tissues: Negative. IMPRESSION: 1. No acute intracranial abnormality. 2. No facial bone fractures. Electronically Signed   By: Gerome Sam III M.D   On: 09/22/2017 16:32     Assessment/Plan: 51 year old male  presenting after two episodes of loss of consciousness.  Etiology unclear but seizure is of concern with presentation significant for tongue biting.  Head CT reviewed and shows no acute changes.  UDS negative.  Further work up recommended.  Neurological examination nonfocal.    Recommendations: 1.  EEG 2.  MRI of the brain with and without contrast 3.  Seizure precautions 4.  Patient unable to drive, operate heavy machinery, perform activities at heights and participate in water activities until release by outpatient physician.  Case discussed with Dr. Cora Collum,  MD Neurology [phone] 09/23/2017, 12:48 PM

## 2017-09-23 NOTE — Progress Notes (Signed)
No events overnight, pnt resting appears comfortable. Bed rails remain padded no seizure activity seen/observed this shift. Bed low and locked with call bell in reach. Will continue to monitor and assess.

## 2017-09-24 ENCOUNTER — Observation Stay: Payer: BLUE CROSS/BLUE SHIELD

## 2017-09-24 DIAGNOSIS — R55 Syncope and collapse: Secondary | ICD-10-CM

## 2017-09-24 NOTE — Discharge Summary (Signed)
[facility] - [place] at [facility], 51 y.o., DOB 1966-12-03, MRN [id number]. Admission date: 09/22/2017 Discharge Date 09/24/2017 Primary MD Patient, No Pcp Per Admitting Physician Houston Siren, MD  Admission Diagnosis  Syncope [R55] Laceration of tongue, initial encounter [S01.512A] Contusion of right orbit, initial encounter [S05.11XA] Syncope, unspecified syncope type [R55]  Discharge Diagnosis   Active Problems:   Seizure   Anxiety/depression   History of asthma   History of psychosis       Hospital Course   Scott West  is a 51 y.o. male with a known history of anxiety, psychosis, hyperlipidemia who presents to the hospital after a suspected syncopal/seizure episode. Patient says he woke up this morning and was feeling fine ate some candy corn for breakfast and was then going to drive to [place] to go visit his mom. Patient was found outside the gas station.He was suspected to have a seizure. Patient is on multiple psych medications which have been recently adjusted. The cause of seizure coulds negative. He also had EEG that was negative. Neurology stated that since this will possible for seizure recommended no antiepileptics outpatient neurology follow-up for seizure medications. Patient was advised on no driving for the next 6 months. As well as taking ae seizure prs. He also needs to follow up with his psychiatrist to see if any of his blood medications could be discontinued or adjusted.           Consults  neurology  Significant Tests:  See full reports for all details     Ct Head Wo Contrast  Result Date: 09/22/2017 CLINICAL DATA:  Seizure. EXAM: CT HEAD WITHOUT CONTRAST CT MAXILLOFACIAL WITHOUT CONTRAST TECHNIQUE: Multidetector CT imaging of the head and maxillofacial structures were performed using the standard protocol without intravenous contrast. Multiplanar CT image reconstructions of the maxillofacial structures were also  generated. COMPARISON:  None. FINDINGS: CT HEAD FINDINGS Brain: No evidence of acute infarction, hemorrhage, hydrocephalus, extra-axial collection or mass lesion/mass effect. Vascular: No hyperdense vessel or unexpected calcification. Skull: Normal. Negative for fracture or focal lesion. Other: None. CT MAXILLOFACIAL FINDINGS Osseous: No fracture or mandibular dislocation. No destructive process. Orbits: Negative. No traumatic or inflammatory finding. Sinuses: Clear. Soft tissues: Negative. IMPRESSION: 1. No acute intracranial abnormality. 2. No facial bone fractures. Electronically Signed   By: Gerome Sam III M.D   On: 09/22/2017 16:32   Mr Brain W Wo Contrast  Result Date: 09/23/2017 CLINICAL DATA:  New onset seizure EXAM: MRI HEAD WITHOUT AND WITH CONTRAST TECHNIQUE: Multiplanar, multiecho pulse sequences of the brain and surrounding structures were obtained without and with intravenous contrast. CONTRAST:  20mL MULTIHANCE GADOBENATE DIMEGLUMINE 529 MG/ML IV SOLN COMPARISON:  09/22/2017 FINDINGS: Brain: No acute infarction, hemorrhage, hydrocephalus, extra-axial collection or mass lesion. Normal enhancement following contrast infusion. Vascular: Normal arterial flow void. Skull and upper cervical spine: Negative Sinuses/Orbits: Negative Other: None IMPRESSION: Negative MRI head with contrast. Electronically Signed   By: Marlan Palau M.D.   On: 09/23/2017 16:43   US Carotid Bilateral  Result Date: 09/23/2017 CLINICAL DATA:  Syncope, hyperlipidemia EXAM: BILATERAL CAROTID DUPLEX ULTRASOUND TECHNIQUE: Wallace Cullens scale imaging, color Doppler and duplex ultrasound were performed of bilateral carotid and vertebral arteries in the neck. COMPARISON:  None available FINDINGS: Criteria: Quantification of carotid stenosis is based on velocity parameters that correlate the residual internal carotid diameter with NASCET-based stenosis levels, using the diameter of the distal internal carotid lumen as the  denominator for stenosis measurement. The following  velocity measurements were obtained: RIGHT ICA:  69/26 cm/sec CCA:  102/19 cm/sec SYSTOLIC ICA/CCA RATIO:  0.7 DIASTOLIC ICA/CCA RATIO:  1.4 ECA:  91 cm/sec LEFT ICA:  71/30 cm/sec CCA:  92/23 cm/sec SYSTOLIC ICA/CCA RATIO:  0.8 DIASTOLIC ICA/CCA RATIO:  1.3 ECA:  91 cm/sec RIGHT CAROTID ARTERY: Minor intimal thickening and atherosclerotic change. No hemodynamically significant right ICA stenosis, velocity elevation, or turbulent flow. Degree of narrowing less than 50%. RIGHT VERTEBRAL ARTERY:  Antegrade LEFT CAROTID ARTERY: Similar scattered minor intimal thickening and atherosclerosis. No hemodynamically significant left ICA stenosis, velocity elevation, or turbulent flow. LEFT VERTEBRAL ARTERY:  Antegrade IMPRESSION: Minor carotid intimal thickening and atherosclerosis. No hemodynamically significant ICA stenosis. Degree of narrowing less than 50% bilaterally by ultrasound criteria. Patent antegrade vertebral flow bilaterally Electronically Signed   By: Judie Petit.  Shick M.D.   On: 09/23/2017 09:58   Ct Maxillofacial Wo Contrast  Result Date: 09/22/2017 CLINICAL DATA:  Seizure. EXAM: CT HEAD WITHOUT CONTRAST CT MAXILLOFACIAL WITHOUT CONTRAST TECHNIQUE: Multidetector CT imaging of the head and maxillofacial structures were performed using the standard protocol without intravenous contrast. Multiplanar CT image reconstructions of the maxillofacial structures were also generated. COMPARISON:  None. FINDINGS: CT HEAD FINDINGS Brain: No evidence of acute infarction, hemorrhage, hydrocephalus, extra-axial collection or mass lesion/mass effect. Vascular: No hyperdense vessel or unexpected calcification. Skull: Normal. Negative for fracture or focal lesion. Other: None. CT MAXILLOFACIAL FINDINGS Osseous: No fracture or mandibular dislocation. No destructive process. Orbits: Negative. No traumatic or inflammatory finding. Sinuses: Clear. Soft tissues: Negative. IMPRESSION:  1. No acute intracranial abnormality. 2. No facial bone fractures. Electronically Signed   By: Gerome Sam III M.D   On: 09/22/2017 16:32       Today   Subjective:   Scott West patient anxious to go home  Objective:   Blood pressure 131/80, pulse 77, temperature 98.2 F (36.8 C), temperature source Oral, resp. rate 16, height 5\' 9"  (1.753 m), weight 215 lb (97.5 kg), SpO2 98 %.  .  Intake/Output Summary (Last 24 hours) at 09/24/17 1523 Last data filed at 09/24/17 1034  Gross per 24 hour  Intake              960 ml  Output                0 ml  Net              960 ml    Exam VITAL SIGNS: Blood pressure 131/80, pulse 77, temperature 98.2 F (36.8 C), temperature source Oral, resp. rate 16, height 5\' 9"  (1.753 m), weight 215 lb (97.5 kg), SpO2 98 %.  GENERAL:  51 y.o.-year-old patient lying in the bed with no acute distress.  EYES: Pupils equal, round, reactive to light and accommodation. No scleral icterus. Extraocular muscles intact.  HEENT: Head atraumatic, normocephalic. Oropharynx and nasopharynx clear.  NECK:  Supple, no jugular venous distention. No thyroid enlargement, no tenderness.  LUNGS: Normal breath sounds bilaterally, no wheezing, rales,rhonchi or crepitation. No use of accessory muscles of respiration.  CARDIOVASCULAR: S1, S2 normal. No murmurs, rubs, or gallops.  ABDOMEN: Soft, nontender, nondistended. Bowel sounds present. No organomegaly or mass.  EXTREMITIES: No pedal edema, cyanosis, or clubbing.  NEUROLOGIC: Cranial nerves II through XII are intact. Muscle strength 5/5 in all extremities. Sensation intact. Gait not checked.  PSYCHIATRIC: The patient is alert and oriented x 3.  SKIN: No obvious rash, lesion, or ulcer.   Data Review     CBC w  Diff: Lab Results  Component Value Date   WBC 6.0 09/23/2017   HGB 14.1 09/23/2017   HCT 40.9 09/23/2017   PLT 234 09/23/2017   CMP: Lab Results  Component Value Date   NA 139 09/23/2017   K 3.7  09/23/2017   CL 107 09/23/2017   CO2 24 09/23/2017   BUN 7 09/23/2017   CREATININE 1.05 09/23/2017   PROT 6.8 09/22/2017   ALBUMIN 3.9 09/22/2017   BILITOT 0.8 09/22/2017   ALKPHOS 53 09/22/2017   AST 36 09/22/2017   ALT 14 (L) 09/22/2017  .  Micro Results No results found for this or any previous visit (from the past 240 hour(s)).      Code Status Orders        Start     Ordered   09/22/17 2038  Full code  Continuous     09/22/17 2037    Code Status History    Date Active Date Inactive Code Status Order ID Comments User Context   This patient has a current code status but no historical code status.          Follow-up Information    Your Primary Care Physician. Schedule an appointment as soon as possible for a visit in 1 day(s).        Lonell FaceShah, Hemang K, MD On 09/26/2017.   Specialty:  Neurology Why:  new onset sz. Please keep your follow up appointment for Wednesday 09/26/17 @ 11:15 am.  Please arrive 30 mins. early to this appointment as you are a new patient. Contact information: [address] [facility] [place] [address] [phone]        pimary pychiatrist.           Discharge Medications   Allergies as of 09/24/2017      Reactions   Brethine [terbutaline]    Dimetapp Decongestant [pseudoephedrine]       Medication List    TAKE these medications   atorvastatin 40 MG tablet Commonly known as:  LIPITOR Take 40 mg by mouth daily.   citalopram 20 MG tablet Commonly known as:  CELEXA Take 30 mg by mouth daily.   gabapentin 600 MG tablet Commonly known as:  NEURONTIN Take 900 mg by mouth 3 (three) times daily.   hydrOXYzine 25 MG tablet Commonly known as:  ATARAX/VISTARIL Take 25 mg by mouth every 6 (six) hours as needed.   QUEtiapine 50 MG tablet Commonly known as:  SEROQUEL Take 50 mg by mouth daily.   QUEtiapine 400 MG 24 hr tablet Commonly known as:  SEROQUEL XR Take 400 mg by mouth at  bedtime.   ranitidine 150 MG tablet Commonly known as:  ZANTAC Take 150 mg by mouth as needed for heartburn.          Total Time in preparing paper work, data evaluation and todays exam - 35 minutes  Auburn BilberryPATEL, Jerrico Covello M.D on 09/24/2017 at 3:23 PM  [facility]   Office  [phone]

## 2017-09-24 NOTE — Discharge Instructions (Signed)
[facility] - [place] at [facility]  DIET:  Regular diet  DISCHARGE CONDITION:  Stable  ACTIVITY:  Activity as tolerated  OXYGEN:  Home Oxygen: No.   Oxygen Delivery: room air  DISCHARGE LOCATION:  home    ADDITIONAL DISCHARGE INSTRUCTION:no driving for 6months, sz precaustions   If you experience worsening of your admission symptoms, develop shortness of breath, life threatening emergency, suicidal or homicidal thoughts you must seek medical attention immediately by calling 911 or calling your MD immediately  if symptoms less severe.  You Must read complete instructions/literature along with all the possible adverse reactions/side effects for all the Medicines you take and that have been prescribed to you. Take any new Medicines after you have completely understood and accpet all the possible adverse reactions/side effects.   Please note  You were cared for by a hospitalist during your hospital stay. If you have any questions about your discharge medications or the care you received while you were in the hospital after you are discharged, you can call the unit and asked to speak with the hospitalist on call if the hospitalist that took care of you is not available. Once you are discharged, your primary care physician will handle any further medical issues. Please note that NO REFILLS for any discharge medications will be authorized once you are discharged, as it is imperative that you return to your primary care physician (or establish a relationship with a primary care physician if you do not have one) for your aftercare needs so that they can reassess your need for medications and monitor your lab values.

## 2017-09-24 NOTE — Progress Notes (Signed)
Discharge instructions given with verbalized understanding from patient.  Patient denies pain prior to discharge.  Patient ambulated undependably with volunteer to Emergency Room parking lot to drive self home.

## 2017-09-25 LAB — HIV ANTIBODY (ROUTINE TESTING W REFLEX): HIV SCREEN 4TH GENERATION: NONREACTIVE

## 2018-04-16 ENCOUNTER — Emergency Department
Admission: EM | Admit: 2018-04-16 | Discharge: 2018-04-17 | Disposition: A | Discharge status: Other Acute Inpatient Hospital | Payer: BLUE CROSS/BLUE SHIELD | Attending: Emergency Medicine | Admitting: Emergency Medicine

## 2018-04-16 ENCOUNTER — Other Ambulatory Visit: Payer: Self-pay

## 2018-04-16 DIAGNOSIS — F29 Unspecified psychosis not due to a substance or known physiological condition: Secondary | ICD-10-CM

## 2018-04-16 DIAGNOSIS — F1010 Alcohol abuse, uncomplicated: Secondary | ICD-10-CM | POA: Insufficient documentation

## 2018-04-16 DIAGNOSIS — F1490 Cocaine use, unspecified, uncomplicated: Secondary | ICD-10-CM

## 2018-04-16 DIAGNOSIS — J45909 Unspecified asthma, uncomplicated: Secondary | ICD-10-CM | POA: Insufficient documentation

## 2018-04-16 DIAGNOSIS — F250 Schizoaffective disorder, bipolar type: Secondary | ICD-10-CM | POA: Diagnosis not present

## 2018-04-16 DIAGNOSIS — F17200 Nicotine dependence, unspecified, uncomplicated: Secondary | ICD-10-CM | POA: Diagnosis not present

## 2018-04-16 LAB — CBC
HEMATOCRIT: 46 % (ref 40.0–52.0)
HEMOGLOBIN: 15.8 g/dL (ref 13.0–18.0)
MCH: 29.9 pg (ref 26.0–34.0)
MCHC: 34.3 g/dL (ref 32.0–36.0)
MCV: 87 fL (ref 80.0–100.0)
PLATELETS: 285 10*3/uL (ref 150–440)
RBC: 5.29 MIL/uL (ref 4.40–5.90)
RDW: 14.3 % (ref 11.5–14.5)
WBC: 10.3 10*3/uL (ref 3.8–10.6)

## 2018-04-16 LAB — COMPREHENSIVE METABOLIC PANEL
ALBUMIN: 4.1 g/dL (ref 3.5–5.0)
ALT: 59 U/L (ref 17–63)
AST: 61 U/L — AB (ref 15–41)
Alkaline Phosphatase: 63 U/L (ref 38–126)
Anion gap: 8 (ref 5–15)
BUN: 17 mg/dL (ref 6–20)
CALCIUM: 8.5 mg/dL — AB (ref 8.9–10.3)
CHLORIDE: 106 mmol/L (ref 101–111)
CO2: 22 mmol/L (ref 22–32)
CREATININE: 0.88 mg/dL (ref 0.61–1.24)
GFR calc non Af Amer: >60 mL/min (ref >60)
GLUCOSE: 83 mg/dL (ref 65–99)
Potassium: 3.8 mmol/L (ref 3.5–5.1)
SODIUM: 136 mmol/L (ref 135–145)
Total Bilirubin: 1.7 mg/dL — ABNORMAL HIGH (ref 0.3–1.2)
Total Protein: 6.8 g/dL (ref 6.5–8.1)

## 2018-04-16 LAB — ETHANOL: Alcohol, Ethyl (B): <10 mg/dL (ref <10)

## 2018-04-16 LAB — ACETAMINOPHEN LEVEL: Acetaminophen (Tylenol), Serum: <10 ug/mL — ABNORMAL LOW (ref 10–30)

## 2018-04-16 LAB — SALICYLATE LEVEL

## 2018-04-16 MED ORDER — LORAZEPAM 2 MG PO TABS
2.0000 mg | ORAL_TABLET | Freq: Once | ORAL | Status: AC
  Administered 2018-04-16: 2 mg via ORAL
  Filled 2018-04-16: qty 1

## 2018-04-16 NOTE — ED Notes (Addendum)
Dressed out by Jeannett Senior, Charity fundraiser.

## 2018-04-16 NOTE — ED Triage Notes (Signed)
Pt to ER via POV requesting psychiatric treatment. Pt denies SI or HI but feels that he "is setting things in motion that are not supposed to be". Pt cannot function at work or make decisions. Pt poor historian, homeless. Pt rambles on and on, has a hard time answering questions directly.

## 2018-04-16 NOTE — ED Notes (Signed)
SOC complete and SOC machine moved out of pt room

## 2018-04-16 NOTE — ED Provider Notes (Signed)
[facility] Emergency Department Provider Note  ____________________________________________  Time seen: Approximately 7:47 PM  I have reviewed the triage vital signs and the nursing notes.   HISTORY  Chief Complaint Psychiatric Evaluation    HPI Scott West is a 52 y.o. male with a history of psychosis and anxiety presenting with psychosis.  The patient came here voluntarily, but cannot give me a clear story as to why he is here.  He said he "had some words with my mother and then I thought certain events would occur."  He cannot tell me which events he means.  He denies any SI, HI or hallucinations.  He has no medical complaints at this time.  He does report cocaine use yesterday and alcohol use this morning.   Past Medical History:  Diagnosis Date  . Anxiety   . Asthma   . Psychosis [facility])     Patient Active Problem List   Diagnosis Date Noted  . Syncope 09/22/2017    Past Surgical History:  Procedure Laterality Date  . HERNIA REPAIR      Current Outpatient Rx  . Order #: [id number] Class: Historical Med  . Order #: [id number] Class: Historical Med  . Order #: [id number] Class: Historical Med  . Order #: [id number] Class: Historical Med  . Order #: [id number] Class: Historical Med  . Order #: [id number] Class: Historical Med  . Order #: [id number] Class: Historical Med    Allergies Brethine [terbutaline] and Dimetapp decongestant [pseudoephedrine]  Family History  Problem Relation Age of Onset  . Hypertension Mother   . Heart disease Father     Social History Social History   Tobacco Use  . Smoking status: Current Every Day Smoker    Types: E-cigarettes  . Smokeless tobacco: Never Used  Substance Use Topics  . Alcohol use: Yes    Comment: former 2007  . Drug use: Yes    Types: Marijuana    Comment: not for 1 year    Review of Systems Constitutional: No fever/chills. No lightheadedness or syncope. Eyes: No visual changes. ENT: No sore  throat. No congestion or rhinorrhea. Cardiovascular: Denies chest pain. Denies palpitations. Respiratory: Denies shortness of breath.  No cough. Gastrointestinal: No abdominal pain.  No nausea, no vomiting.  No diarrhea.  No constipation. Skin: Negative for rash. Neurological: Negative for headaches. No focal numbness, tingling or weakness.  Psychiatric:+ ETOH and cocaine abuse; + psychosis.  SI, HI or hallucinations  ____________________________________________   PHYSICAL EXAM:  VITAL SIGNS: ED Triage Vitals  Enc Vitals Group     BP 04/16/18 1715 (!) 145/90     Pulse Rate 04/16/18 1715 76     Resp 04/16/18 1715 20     Temp 04/16/18 1715 98.8 F (37.1 C)     Temp Source 04/16/18 1715 Oral     SpO2 04/16/18 1715 98 %     Weight 04/16/18 1716 200 lb (90.7 kg)     Height 04/16/18 1716  (1.753 m)     Head Circumference --      Peak Flow --      Pain Score 04/16/18 1723 0     Pain Loc --      Pain Edu? --      Excl. in GC? --     Constitutional: Alert and oriented. Answers questions appropriately.  Tonically ill-appearing but in no acute distress. Eyes: Conjunctivae are normal.  EOMI. No scleral icterus. Head: Atraumatic. Nose: No congestion/rhinnorhea. Mouth/Throat: Mucous membranes are moist.  Neck: No stridor.  Supple.  No meningismus. Cardiovascular: Normal rate, regular rhythm. No murmurs, rubs or gallops.  Respiratory: Normal respiratory effort.  No accessory muscle use or retractions. Lungs CTAB.  No wheezes, rales or ronchi. Musculoskeletal: Moves all extremities well.   Neurologic:  A&Ox3.  Speech is clear.  Face and smile are symmetric.  EOMI.  Moves all extremities well. Skin:  Skin is warm, dry and intact. No rash noted. Psychiatric: The patient demonstrates evidence of psychosis with flight of ideas and is not forthright in his answers.  He does not have pressured speech.  He denies any SI, HI or  hallucinations.  ____________________________________________   LABS (all labs ordered are listed, but only abnormal results are displayed)  Labs Reviewed  COMPREHENSIVE METABOLIC PANEL - Abnormal; Notable for the following components:      Result Value   Calcium 8.5 (*)    AST 61 (*)    Total Bilirubin 1.7 (*)    All other components within normal limits  ACETAMINOPHEN LEVEL - Abnormal; Notable for the following components:   Acetaminophen (Tylenol), Serum <10 (*)    All other components within normal limits  ETHANOL  SALICYLATE LEVEL  CBC  URINE DRUG SCREEN, QUALITATIVE ([facility] ONLY)   ____________________________________________  EKG  Not indicated ____________________________________________  RADIOLOGY  No results found.  ____________________________________________   PROCEDURES  Procedure(s) performed: None  Procedures  Critical Care performed: No ____________________________________________   INITIAL IMPRESSION / ASSESSMENT AND PLAN / ED COURSE  Pertinent labs & imaging results that were available during my care of the patient were reviewed by me and considered in my medical decision making (see chart for details).  52 y.o. male w/ a hx of psychosis presenting w/ psychosis; does report recent cocaine and ETOH use.  Overall, patient is hemodynamically stable and has no medical complaints.  At this time, he is medically cleared for psychiatric disposition.  He has been placed under involuntary commitment and will be seen by the psychiatrist for final disposition.  ____________________________________________  FINAL CLINICAL IMPRESSION(S) / ED DIAGNOSES  Final diagnoses:  Psychosis, unspecified psychosis type (HCC)  Cocaine use  Alcohol abuse         NEW MEDICATIONS STARTED DURING THIS VISIT:  New Prescriptions   No medications on file      Rockne Menghini, MD 04/16/18 2002

## 2018-04-17 ENCOUNTER — Inpatient Hospital Stay
Admission: AD | Admit: 2018-04-17 | Discharge: 2018-04-25 | DRG: 885 | Disposition: A | Discharge status: Home / Self Care | Payer: BLUE CROSS/BLUE SHIELD | Source: Intra-hospital | Attending: Psychiatry | Admitting: Psychiatry

## 2018-04-17 ENCOUNTER — Other Ambulatory Visit: Payer: Self-pay

## 2018-04-17 DIAGNOSIS — G629 Polyneuropathy, unspecified: Secondary | ICD-10-CM | POA: Diagnosis present

## 2018-04-17 DIAGNOSIS — J45909 Unspecified asthma, uncomplicated: Secondary | ICD-10-CM | POA: Diagnosis present

## 2018-04-17 DIAGNOSIS — B1920 Unspecified viral hepatitis C without hepatic coma: Secondary | ICD-10-CM | POA: Diagnosis present

## 2018-04-17 DIAGNOSIS — Z8249 Family history of ischemic heart disease and other diseases of the circulatory system: Secondary | ICD-10-CM | POA: Diagnosis not present

## 2018-04-17 DIAGNOSIS — F22 Delusional disorders: Principal | ICD-10-CM | POA: Diagnosis present

## 2018-04-17 DIAGNOSIS — Z915 Personal history of self-harm: Secondary | ICD-10-CM

## 2018-04-17 DIAGNOSIS — Z79899 Other long term (current) drug therapy: Secondary | ICD-10-CM | POA: Diagnosis not present

## 2018-04-17 DIAGNOSIS — F17290 Nicotine dependence, other tobacco product, uncomplicated: Secondary | ICD-10-CM | POA: Diagnosis present

## 2018-04-17 DIAGNOSIS — K219 Gastro-esophageal reflux disease without esophagitis: Secondary | ICD-10-CM | POA: Diagnosis present

## 2018-04-17 DIAGNOSIS — G40909 Epilepsy, unspecified, not intractable, without status epilepticus: Secondary | ICD-10-CM | POA: Diagnosis present

## 2018-04-17 DIAGNOSIS — Z818 Family history of other mental and behavioral disorders: Secondary | ICD-10-CM | POA: Diagnosis not present

## 2018-04-17 DIAGNOSIS — F1220 Cannabis dependence, uncomplicated: Secondary | ICD-10-CM | POA: Diagnosis present

## 2018-04-17 DIAGNOSIS — Z888 Allergy status to other drugs, medicaments and biological substances status: Secondary | ICD-10-CM | POA: Diagnosis not present

## 2018-04-17 DIAGNOSIS — F329 Major depressive disorder, single episode, unspecified: Secondary | ICD-10-CM | POA: Diagnosis present

## 2018-04-17 DIAGNOSIS — F419 Anxiety disorder, unspecified: Secondary | ICD-10-CM | POA: Diagnosis present

## 2018-04-17 DIAGNOSIS — F1420 Cocaine dependence, uncomplicated: Secondary | ICD-10-CM | POA: Diagnosis present

## 2018-04-17 DIAGNOSIS — F17200 Nicotine dependence, unspecified, uncomplicated: Secondary | ICD-10-CM | POA: Diagnosis present

## 2018-04-17 DIAGNOSIS — F250 Schizoaffective disorder, bipolar type: Secondary | ICD-10-CM | POA: Diagnosis not present

## 2018-04-17 LAB — URINE DRUG SCREEN, QUALITATIVE (ARMC ONLY)
AMPHETAMINES, UR SCREEN: NOT DETECTED
BENZODIAZEPINE, UR SCRN: NOT DETECTED
Barbiturates, Ur Screen: NOT DETECTED
COCAINE METABOLITE, UR ~~LOC~~: POSITIVE — AB
Cannabinoid 50 Ng, Ur ~~LOC~~: NOT DETECTED
MDMA (ECSTASY) UR SCREEN: NOT DETECTED
METHADONE SCREEN, URINE: NOT DETECTED
OPIATE, UR SCREEN: NOT DETECTED
Phencyclidine (PCP) Ur S: NOT DETECTED
Tricyclic, Ur Screen: POSITIVE — AB

## 2018-04-17 MED ORDER — MAGNESIUM HYDROXIDE 400 MG/5ML PO SUSP: 30.0000 mL | Freq: Every day | ORAL | Status: DC | PRN

## 2018-04-17 MED ORDER — GABAPENTIN 600 MG PO TABS
900.0000 mg | ORAL_TABLET | Freq: Three times a day (TID) | ORAL | Status: DC
  Administered 2018-04-17 – 2018-04-25 (×23): 900 mg via ORAL
  Filled 2018-04-17 (×24): qty 2

## 2018-04-17 MED ORDER — ALUM & MAG HYDROXIDE-SIMETH 200-200-20 MG/5ML PO SUSP: 30.0000 mL | ORAL | Status: DC | PRN

## 2018-04-17 MED ORDER — LAMOTRIGINE 25 MG PO TABS
75.0000 mg | ORAL_TABLET | Freq: Two times a day (BID) | ORAL | Status: DC
  Administered 2018-04-17 – 2018-04-25 (×16): 75 mg via ORAL
  Filled 2018-04-17 (×16): qty 3

## 2018-04-17 MED ORDER — HYDROXYZINE HCL 25 MG PO TABS
25.0000 mg | ORAL_TABLET | Freq: Three times a day (TID) | ORAL | Status: DC | PRN
  Administered 2018-04-20 – 2018-04-23 (×3): 25 mg via ORAL
  Filled 2018-04-17 (×3): qty 1

## 2018-04-17 MED ORDER — TRAZODONE HCL 100 MG PO TABS: 100.0000 mg | ORAL_TABLET | Freq: Every evening | ORAL | Status: DC | PRN

## 2018-04-17 MED ORDER — QUETIAPINE FUMARATE ER 200 MG PO TB24
400.0000 mg | ORAL_TABLET | Freq: Every day | ORAL | Status: DC
  Administered 2018-04-17: 400 mg via ORAL
  Filled 2018-04-17 (×2): qty 2

## 2018-04-17 MED ORDER — ACETAMINOPHEN 325 MG PO TABS
650.0000 mg | ORAL_TABLET | Freq: Four times a day (QID) | ORAL | Status: DC | PRN
  Administered 2018-04-20 – 2018-04-25 (×10): 650 mg via ORAL
  Filled 2018-04-17 (×10): qty 2

## 2018-04-17 MED ORDER — ATORVASTATIN CALCIUM 20 MG PO TABS
40.0000 mg | ORAL_TABLET | Freq: Every day | ORAL | Status: DC
  Administered 2018-04-17 – 2018-04-25 (×9): 40 mg via ORAL
  Filled 2018-04-17 (×9): qty 2

## 2018-04-17 NOTE — BH Assessment (Signed)
Assessment Note  Scott West is an 52 y.o. male arrived voluntarily to ED due to unclear reasons. Pt reports "I was worrying about stuff that might happen. I mean computers and key fobs keep messing up." "My Zenaida Niece was messing up." "Today I used alcohol and substances." Pt displayed a great deal of disorganized speech, persecutory delusions, and paranoia. Pt reports that when he sees certain street signs he correlates them to things people have told him. Pt currently separated from wife and sleeping in [place]. Writer contacted wife Shayde Gervacio at [phone]) for collateral information.   Per wife: Pt has hx of drug abuse and alcohol use. Pt has confessed to using cocaine recently and has been committed twice for generalized anxiety disorder. Ms. Banik indicates that pt has hx of not being truthful with doctors. Mrs Gotay shared that pt thinks black drones are following him and pt accuses her of trying to have him committed. Pt currently works for a company that [employer]. Per Mrs. Bilton reports, pt has taken pills in medical carts when nurses leave them and uses them recreationally. Mrs. Skaggs also indicated that pt has put in cameras at her home without telling her. Pt has hx of paranoia and is sometimes scared to leave home and refuses to go to work.  Diagnosis: Schizoaffective disorder, bipolar type  Past Medical History:  Past Medical History:  Diagnosis Date  . Anxiety   . Asthma   . Psychosis [facility])     Past Surgical History:  Procedure Laterality Date  . HERNIA REPAIR      Family History:  Family History  Problem Relation Age of Onset  . Hypertension Mother   . Heart disease Father     Social History:  reports that he has been smoking e-cigarettes.  He has never used smokeless tobacco. He reports that he drinks alcohol. He reports that he has current or past drug history. Drug: Marijuana.  Additional Social History:  Alcohol / Drug Use Pain Medications: see  mar Prescriptions: see mar Over the Counter: see mar History of alcohol / drug use?: Yes Negative Consequences of Use: Financial, Personal relationships, Work / Mining engineer #1 Name of Substance 1: opiate 1 - Age of First Use: 30 1 - Amount (size/oz): varies 1 - Frequency: varies 1 - Duration: varies 1 - Last Use / Amount: pt reports 2015 Substance #2 Name of Substance 2: alcohol 2 - Age of First Use: 21 2 - Amount (size/oz): varies 2 - Frequency: pt reports that he used alcohol yesterday, last use prior was a month ago 2 - Duration: varies 2 - Last Use / Amount: yesterday Substance #3 Name of Substance 3: cocaine 3 - Age of First Use: 20 3 - Amount (size/oz): varies 3 - Frequency: pt reports that he used alcohol yesterday, last use prior was a month ago 3 - Duration: varies 3 - Last Use / Amount: yesterday  CIWA: CIWA-Ar BP: (!) 145/90 Pulse Rate: 76 COWS:    Allergies:  Allergies  Allergen Reactions  . Brethine [Terbutaline]   . Brompheniramine Hives  . Dimetapp Decongestant [Pseudoephedrine]     Home Medications:  (Not in a hospital admission)  OB/GYN Status:  No LMP for male patient.  General Assessment Data Location of Assessment: [facility] ED TTS Assessment: In system Is this a Tele or Face-to-Face Assessment?: Face-to-Face Is this an Initial Assessment or a Re-assessment for this encounter?: Initial Assessment Marital status: Separated Maiden name: N/A Is patient pregnant?: No Pregnancy  Status: No Living Arrangements: Alone Can pt return to current living arrangement?: Yes(Pt reports he currently lives in his car) Admission Status: Involuntary Is patient capable of signing voluntary admission?: No Referral Source: Self/Family/Friend Insurance type: ([employer])  Medical Screening Exam [facility] Walk-in ONLY) Medical Exam completed: Yes  Crisis Care Plan Living Arrangements: Alone Legal Guardian: (self) Name of Psychiatrist: [facility]- med mgmt) Name of Therapist: (None indicated)  Education Status Is patient currently in school?: No Is the patient employed, unemployed or receiving disability?: Employed  Risk to self with the past 6 months Suicidal Ideation: No Has patient been a risk to self within the past 6 months prior to admission? : No Has patient had any suicidal intent within the past 6 months prior to admission? : No Is patient at risk for suicide?: No, but patient needs Medical Clearance Suicidal Plan?: No Has patient had any suicidal plan within the past 6 months prior to admission? : No Access to Means: No What has been your use of drugs/alcohol within the last 12 months?: (Pt reports he has used alcohol, cocaine,and opiates recently) Previous Attempts/Gestures: No How many times?: 0 Other Self Harm Risks: Drug and alcohol use Triggers for Past Attempts: None known Intentional Self Injurious Behavior: None Family Suicide History: No Recent stressful life event(s): (homelessness, separation from wife, drug/ alcohol use, anxie) Persecutory voices/beliefs?: Yes Depression: Yes Depression Symptoms: Insomnia, Isolating, Fatigue, Loss of interest in usual pleasures, Feeling worthless/self pity Substance abuse history and/or treatment for substance abuse?: Yes Suicide prevention information given to non-admitted patients: Not applicable  Risk to Others within the past 6 months Homicidal Ideation: No Does patient have any lifetime risk of violence toward others beyond the six months prior to admission? : No Thoughts of Harm to Others: No Current Homicidal Intent: No Current Homicidal Plan: No Access to Homicidal Means: No Identified Victim: N/A History of harm to others?: No Assessment of Violence: None Noted Violent Behavior Description: (None described) Does patient have access to weapons?: (Pt reports none) Criminal Charges Pending?: No Does patient have a court date: No Is patient on  probation?: Unknown  Psychosis Hallucinations: None noted Delusions: Persecutory  Mental Status Report Appearance/Hygiene: Disheveled Eye Contact: Fair Motor Activity: Restlessness, Shuffling Speech: Incoherent, Rapid Level of Consciousness: Alert Mood: Anxious, Suspicious, Helpless, Preoccupied Affect: Anxious, Preoccupied Anxiety Level: Moderate Thought Processes: Flight of Ideas Judgement: Impaired Orientation: Appropriate for developmental age Obsessive Compulsive Thoughts/Behaviors: Minimal  Cognitive Functioning Concentration: Poor Memory: Recent Impaired Is patient IDD: (Unknown) Is patient DD?: Unknown Insight: Fair Impulse Control: Fair Appetite: Good Have you had any weight changes? : No Change Sleep: Decreased Total Hours of Sleep: (5) Vegetative Symptoms: None  ADLScreening [facility] Assessment Services) Patient's cognitive ability adequate to safely complete daily activities?: Yes Patient able to express need for assistance with ADLs?: Yes Independently performs ADLs?: Yes (appropriate for developmental age)  Prior Inpatient Therapy Prior Inpatient Therapy: Yes Prior Therapy Dates: 2017 Prior Therapy Facilty/Provider(s): [facility] Reason for Treatment: paranoia, delusions  Prior Outpatient Therapy Prior Outpatient Therapy: (Unable to provide name, word salad)  ADL Screening (condition at time of admission) Patient's cognitive ability adequate to safely complete daily activities?: Yes Is the patient deaf or have difficulty hearing?: No Does the patient have difficulty seeing, even when wearing glasses/contacts?: No Does the patient have difficulty concentrating, remembering, or making decisions?: Yes Patient able to express need for assistance with ADLs?: Yes Does the patient have difficulty dressing or bathing?: No Independently  performs ADLs?: Yes (appropriate for developmental age) Does the patient have difficulty walking or climbing stairs?:  No Weakness of Legs: None Weakness of Arms/Hands: None  Home Assistive Devices/Equipment Home Assistive Devices/Equipment: None  Therapy Consults (therapy consults require a physician order) PT Evaluation Needed: No OT Evalulation Needed: No SLP Evaluation Needed: No Abuse/Neglect Assessment (Assessment to be complete while patient is alone) Abuse/Neglect Assessment Can Be Completed: Yes Physical Abuse: Denies Verbal Abuse: Denies Sexual Abuse: Denies Exploitation of patient/patient's resources: Denies Self-Neglect: Denies Values / Beliefs Cultural Requests During Hospitalization: None Spiritual Requests During Hospitalization: None Consults Spiritual Care Consult Needed: No Social Work Consult Needed: No      Additional Information 1:1 In Past 12 Months?: No CIRT Risk: No Elopement Risk: No Does patient have medical clearance?: Yes     Disposition:  Disposition Initial Assessment Completed for this Encounter: Yes Disposition of Patient: Admit Type of inpatient treatment program: Adult Patient refused recommended treatment: No Mode of transportation if patient is discharged?: Other Patient referred to: Other (Comment)(Inpatient hospitalization)  On Site Evaluation by:   Reviewed with Physician:    Aubery Lapping, MS, [facility] 04/17/2018 2:54 AM

## 2018-04-17 NOTE — ED Notes (Signed)
BEHAVIORAL HEALTH ROUNDING Patient sleeping: No. Patient alert and oriented: yes Behavior appropriate: Yes.  ; If no, describe:  Nutrition and fluids offered: yes Toileting and hygiene offered: Yes  Sitter present: q15 minute observations and security monitoring Law enforcement present: Yes    

## 2018-04-17 NOTE — ED Notes (Signed)
Patient refused shower. 

## 2018-04-17 NOTE — Plan of Care (Signed)
Patient newly admitted to the unit, goals have not been met at this time.

## 2018-04-17 NOTE — ED Notes (Signed)
ED  Is the patient under IVC or is there intent for IVC: Yes.   Is the patient medically cleared: Yes.   Is there vacancy in the ED BHU: Yes.   Is the population mix appropriate for patient:     Is the patient awaiting placement in inpatient or outpatient setting: Yes awaiting a bed to be assigned on [facility]U Has the patient had a psychiatric consult: Yes.  SOC Survey of unit performed for contraband, proper placement and condition of furniture, tampering with fixtures in bathroom, shower, and each patient room: Yes.  ; Findings:  APPEARANCE/BEHAVIOR Calm and cooperative NEURO ASSESSMENT Orientation: oriented x3  Denies pain Hallucinations: No.None noted (Hallucinations) denies  Speech: Normal Gait: normal RESPIRATORY ASSESSMENT Even  Unlabored respirations  CARDIOVASCULAR ASSESSMENT Pulses equal   regular rate  Skin warm and dry   GASTROINTESTINAL ASSESSMENT no GI complaint EXTREMITIES Full ROM  PLAN OF CARE Provide calm/safe environment. Vital signs assessed twice daily. ED BHU Assessment once each 12-hour shift. Collaborate with TTS daily or as condition indicates. Assure the ED provider has rounded once each shift. Provide and encourage hygiene. Provide redirection as needed. Assess for escalating behavior; address immediately and inform ED provider.  Assess family dynamic and appropriateness for visitation as needed: Yes.  ; If necessary, describe findings:  Educate the patient/family about BHU procedures/visitation: Yes.  ; If necessary, describe findings:

## 2018-04-17 NOTE — BH Assessment (Signed)
Patient is to be admitted to [facility] by Dr. Johnella Moloney.  Attending Physician will be Dr. Johnella Moloney.   Patient has been assigned to room 306, by [facility] Charge Nurse Lance Bosch.   Intake Paper Work has been signed and placed on patient chart.  ER staff is aware of the admission:  [facility] ER Kathrynn Speed   Dr. Rance Muir, ER MD   Amy Patient's Nurse   Ethelene Browns Patient Access.  Pt can arrive after 1:30

## 2018-04-17 NOTE — ED Notes (Signed)

## 2018-04-17 NOTE — ED Notes (Signed)
Patient moved from 25 hall to ED 21. Patient was not wanting to move. Explained to patient we needed to be moved because of HIPPA and for privacy. Pt. Alert and oriented, warm and dry, in no distress. Pt. Denies SI, HI, and AVH. Pt. Encouraged to let nursing staff know of any concerns or needs.

## 2018-04-17 NOTE — ED Notes (Signed)
Patient observed lying in bed with eyes closed  Even, unlabored respirations observed   NAD pt appears to be sleeping  I will continue to monitor along with every 15 minute visual observations and ongoing security monitoring    

## 2018-04-17 NOTE — ED Provider Notes (Signed)
-----------------------------------------   6:48 AM on 04/17/2018 -----------------------------------------   Blood pressure (!) 145/90, pulse 76, temperature 98.8 F (37.1 C), temperature source Oral, resp. rate 20, height  (1.753 m), weight 90.7 kg (200 lb), SpO2 98 %.  The patient had no acute events since last update.  Sleeping at this time.  Disposition is pending Psychiatry/Behavioral Medicine team recommendations.       Irean Hong, MD 04/17/18 [phone]

## 2018-04-17 NOTE — Tx Team (Signed)
Initial Treatment Plan 04/17/2018 4:07 PM Scott West [id number]    PATIENT STRESSORS: Financial difficulties Marital or family conflict   PATIENT STRENGTHS: General fund of knowledge Motivation for treatment/growth Religious Affiliation Supportive family/friends   PATIENT IDENTIFIED PROBLEMS: Anxiety  Asthma  Syncopy                 DISCHARGE CRITERIA:  Adequate post-discharge living arrangements Improved stabilization in mood, thinking, and/or behavior  PRELIMINARY DISCHARGE PLAN: Outpatient therapy Return to previous work or school arrangements  PATIENT/FAMILY INVOLVEMENT: This treatment plan has been presented to and reviewed with the patient, Scott West, and/or family member.  The patient and family have been given the opportunity to ask questions and make suggestions.  Jim Desanctis Terron Merfeld, RN 04/17/2018, 4:07 PM

## 2018-04-17 NOTE — ED Notes (Signed)

## 2018-04-17 NOTE — Progress Notes (Signed)
52 year old male admitted to unit. Denies SI/HI, AVH and pain at this time. Patient reports that current stressors are being at odds with his wife, financial difficulties and "My own mind" in general. Patient hyperverbal and very fidgety during admission process and had to be asked to stop moving around while taking his vital signs. Oriented patient to room and unit. Skin and contraband search completed and witnessed by Emeterio Reeve, Charity fundraiser. Skin warm, dry and intact. No contraband found on patient nor his belongings. Admission assessment completed, fluid and nutrition offered. Patient remains safe on the unit with q 15 minute checks.

## 2018-04-18 DIAGNOSIS — F22 Delusional disorders: Principal | ICD-10-CM

## 2018-04-18 LAB — LIPID PANEL
CHOL/HDL RATIO: 3.9 ratio
CHOLESTEROL: 225 mg/dL — AB (ref 0–200)
HDL: 58 mg/dL (ref >40)
LDL Cholesterol: 152 mg/dL — ABNORMAL HIGH (ref 0–99)
Triglycerides: 77 mg/dL (ref <150)
VLDL: 15 mg/dL (ref 0–40)

## 2018-04-18 LAB — TSH: TSH: 1.28 u[IU]/mL (ref 0.350–4.500)

## 2018-04-18 MED ORDER — CITALOPRAM HYDROBROMIDE 20 MG PO TABS
20.0000 mg | ORAL_TABLET | Freq: Every day | ORAL | Status: DC
  Administered 2018-04-18 – 2018-04-25 (×8): 20 mg via ORAL
  Filled 2018-04-18 (×8): qty 1

## 2018-04-18 MED ORDER — QUETIAPINE FUMARATE ER 200 MG PO TB24
400.0000 mg | ORAL_TABLET | Freq: Every day | ORAL | Status: DC
  Administered 2018-04-18: 400 mg via ORAL
  Filled 2018-04-18: qty 2

## 2018-04-18 NOTE — Progress Notes (Signed)
Patient was in bed awake, restless at the beginning of this shift. He was guarded, irritable. Isolative and refusing to participate in evening activities. Medication was taken to him in room (Seroquel ). Patient took the medication then fell asleep. Currently sleeping and staff continue to monitor.

## 2018-04-18 NOTE — H&P (Signed)
Psychiatric Admission Assessment Adult  Patient Identification: Scott West MRN:  [id number] Date of Evaluation:  04/18/2018 Chief Complaint:  Paranoia Principal Diagnosis: Delusional disorder (HCC) Diagnosis:   Patient Active Problem List   Diagnosis Date Noted  . Delusional disorder (HCC) [F22] 04/17/2018    Priority: High  . Syncope [R55] 09/22/2017   History of Present Illness: 52 yo male admitted due to paranoia and bizzare behaviors. He presented on his own requesting psychiatric treatment. He was making bizzare and vague comments. He was rambling and had hard time answering questions. Pt reported at that time that pointed to some paranoid delusions.TTS got collateral from his wife. She reported a history of drug and alcohol abuse. She reported that pt thinking black drones are following him. 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 She also reported that he put cameras in her home without telling her. He has a history of paranoia.   Upon evaluation today, pt is upset that he was admitted. He states, "I just came here to talk to a psychiatrist. I didn't think I would be admitted. I need to go to work. I'm not a danger to myself or others." Pt initially was able to hold it together but paranoid thoughts soon became evident. He states that he "had an episode on Tuesday." He states that he went to his mothers house to get some stuff from there. He became paranoid because "she got a phone call and started acting weird." She feels that she had something to do with him being committed. He states that it was strange that they were having a good conversation and then after the phone rang she was acting "panicky and became suspicious that I was using again." He reports that he has been going through a lot lately. HE is going through a divorce with his wife and she allegedly has a restraining order against him. She kicked him out of the  house and he has been living in his [place] [facility] for the past couple weeks. He does admit that he has a history of being paranoia dn "thinking people are investigating me and looking at me weird. But not so much now." He reports that he does see "vouchers and bugs flying around sometimes." His stories are hard to follow at times. He becomes more paranoid as the interview goes on. He states that he makes odd connections between certain things people say about things they should not know. He gives example of him talking to a coworker about something and he made a comment about another conversation he had with someone else. HE felt this was odd that he knew about that. He gives several examples about things like this. HE is also paranoid about this Clinical research associate taking notes and what is in his chart. He appears anxious at times but overall organized in thoughts. He does admit to some occasional depression with what he is going through. HE was diagnosed with seizures in October. His neurologist recommended decreasing Celexa from 30 mg to 10 mg. He started feeling more depressed on this lower dose so he self increased it to 30 mg. He reports that he relapsed on drugs and alcohol. 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 He reports using cocaine "When I can afford it" since November. Most recently he has  been smoking it but has used IV in the recent past. He reports using a gram of cocaine recently. HE admits taht he used to steal Vicodin from the medical carts that were waiting for police to take them. He denies doing this recently and has not used opiates in several months. Pt is on Seroquel XR 400 mg daily and has been compliant with this. He is also on Lamictal for seizures and reports compliance with this. He denies active SI. He states, "There have been times that I wish the good Lord would just take me but if not it's  not my time." He denies plan or intent to take his own life. He denies HI. He states that his goals are to find a place once he can save money to do so.  Associated Signs/Symptoms: Depression Symptoms:  depressed mood, fatigue, (Hypo) Manic Symptoms:  Hallucinations, Anxiety Symptoms:  Excessive Worry, Psychotic Symptoms:  Delusions, Hallucinations: Visual Paranoia,  Delusions of reference PTSD Symptoms: Negative Total Time spent with patient: 1 hour  Past Psychiatric History: Per chart, history of brief psychosis and delusional disorder. He sees Dr. Delaney Meigs at [facility]. He reports he has not seen him for several months. He was in IOP at [facility]. He reports at least 2-3 inpatient admissions for paranoia. His last was August 2018. He reports 1 suicide attempt as a teenager by overdose on Advil. He reports past medication trials of Effexor, Prozac, Zoloft.   Is the patient at risk to self? No.  Has the patient been a risk to self in the past 6 months? No.  Has the patient been a risk to self within the distant past? No.  Is the patient a risk to others? No.  Has the patient been a risk to others in the past 6 months? No.  Has the patient been a risk to others within the distant past? No.   Alcohol Screening: 1. How often do you have a drink containing alcohol?: 2 to 4 times a month 2. How many drinks containing alcohol do you have on a typical day when you are drinking?: 1 or 2 3. How often do you have six or more drinks on one occasion?: Never AUDIT-C Score: 2 4. How often during the last year have you found that you were not able to stop drinking once you had started?: Less than monthly 5. How often during the last year have you failed to do what was normally expected from you becasue of drinking?: Never 6. How often during the last year have you needed a first drink in the morning to get yourself going after a heavy drinking session?: Never 7. How often during the last  year have you had a feeling of guilt of remorse after drinking?: Less than monthly 8. How often during the last year have you been unable to remember what happened the night before because you had been drinking?: Never 9. Have you or someone else been injured as a result of your drinking?: No 10. Has a relative or friend or a doctor or another health worker been concerned about your drinking or suggested you cut down?: No Alcohol Use Disorder Identification Test Final Score (AUDIT): 4 Intervention/Follow-up: AUDIT Score <7 follow-up not indicated Substance Abuse History in the last 12 months:  Yes.  , alcohol. Cocaine. He reports complete sobriety from 2008-2018 Consequences of Substance Abuse: Medical Consequences:  Paranoia Previous Psychotropic Medications: Yes  Psychological Evaluations: Yes  Past Medical History:  Past  Medical History:  Diagnosis Date  . Anxiety   . Asthma   . Psychosis [facility])     Past Surgical History:  Procedure Laterality Date  . HERNIA REPAIR     Family History:  Family History  Problem Relation Age of Onset  . Hypertension Mother   . Heart disease Father    Family Psychiatric  History: Uncle-schizophrenia Tobacco Screening: Have you used any form of tobacco in the last 30 days? (Cigarettes, Smokeless Tobacco, Cigars, and/or Pipes): No Social History: Born in [place]. Has been in [place] since 1978. He was married but they are going through a divorce. He is currently living in his [place] [facility]. Previously, he was renting a room from his sister's boyfriend. 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  Social History   Substance and Sexual Activity  Alcohol Use Yes   Comment: former 2007     Social History   Substance and Sexual Activity  Drug Use Yes  . Types: Marijuana   Comment: not for 1 year    Additional Social History: Marital status: Separated Separated, when?: September 2018 What types of issues is  patient dealing with in the relationship?: Pt. reports that he became very suspicious of family and her. He went to the hospital and when he was discharged she moved money from their accounts. She was upset witth the security cameras that was put up.  Are you sexually active?: No What is your sexual orientation?: Heterosexual Has your sexual activity been affected by drugs, alcohol, medication, or emotional stress?: No Does patient have children?: Yes How many children?: 1 How is patient's relationship with their children?: Daughter- Pt. reports "I cant say it is real great."     Pain Medications: see mar Prescriptions: see mar Over the Counter: see mar History of alcohol / drug use?: Yes Longest period of sobriety (when/how long): unknown Negative Consequences of Use: Financial, Personal relationships   Name of Substance 2: alcohol 2 - Age of First Use: 21 2 - Amount (size/oz): varies 2 - Frequency: pt reports that he used alcohol on 04/15/18, last use prior was a month ago 2 - Duration: varies 2 - Last Use / Amount: yesterday Name of Substance 3: cocaine 3 - Age of First Use: 20 3 - Amount (size/oz): varies 3 - Frequency: pt reports that he used cocaine  on 04/15/18, last use prior was a month ago 3 - Duration: sporadic use 3 - Last Use / Amount: 04/16/15              Allergies:   Allergies  Allergen Reactions  . Brethine [Terbutaline]   . Brompheniramine Hives  . Dimetapp Decongestant [Pseudoephedrine]    Lab Results:  Results for orders placed or performed during the hospital encounter of 04/17/18 (from the past 48 hour(s))  Lipid panel     Status: Abnormal   Collection Time: 04/18/18  7:06 AM  Result Value Ref Range   Cholesterol 225 (H) 0 - 200 mg/dL   Triglycerides 77 <960 mg/dL   HDL 58 >45 mg/dL   Total CHOL/HDL Ratio 3.9 RATIO   VLDL 15 0 - 40 mg/dL   LDL Cholesterol 409 (H) 0 - 99 mg/dL    Comment:        Total Cholesterol/HDL:CHD Risk Coronary Heart  Disease Risk Table                     Men  Women  1/2 Average Risk   3.4   3.3  Average Risk       5.0   4.4  2 X Average Risk   9.6   7.1  3 X Average Risk  23.4   11.0        Use the calculated Patient Ratio above and the CHD Risk Table to determine the patient's CHD Risk.        ATP III CLASSIFICATION (LDL):  <100     mg/dL   Optimal  161-096  mg/dL   Near or Above                    Optimal  130-159  mg/dL   Borderline  045-409  mg/dL   High  >811     mg/dL   Very High Performed at [facility], [address] [place], [place] [address]   TSH     Status: None   Collection Time: 04/18/18  7:06 AM  Result Value Ref Range   TSH 1.280 0.350 - 4.500 uIU/mL    Comment: Performed by a 3rd Generation assay with a functional sensitivity of <=0.01 uIU/mL. Performed at [facility], [address] [place], [place] [address]     Blood Alcohol level:  Lab Results  Component Value Date   [facility] <10 04/16/2018   ETH <10 09/22/2017    Metabolic Disorder Labs:  No results found for: HGBA1C, MPG No results found for: PROLACTIN Lab Results  Component Value Date   CHOL 225 (H) 04/18/2018   TRIG 77 04/18/2018   HDL 58 04/18/2018   CHOLHDL 3.9 04/18/2018   VLDL 15 04/18/2018   LDLCALC 152 (H) 04/18/2018    Current Medications: Current Facility-Administered Medications  Medication Dose Route Frequency Provider Last Rate Last Dose  . acetaminophen (TYLENOL) tablet 650 mg  650 mg Oral Q6H PRN Caelan Atchley, Ileene Hutchinson, MD      . alum & mag hydroxide-simeth (MAALOX/MYLANTA) 200-200-20 MG/5ML suspension 30 mL  30 mL Oral Q4H PRN Dysen Edmondson R, MD      . atorvastatin (LIPITOR) tablet 40 mg  40 mg Oral Daily Rossi Burdo, Ileene Hutchinson, MD   40 mg at 04/18/18 0831  . citalopram (CELEXA) tablet 20 mg  20 mg Oral Daily Tenelle Andreason, Ileene Hutchinson, MD   20 mg at 04/18/18 1032  . gabapentin (NEURONTIN) tablet 900 mg  900 mg Oral TID Haskell Riling, MD   900 mg at 04/18/18 0831  . hydrOXYzine  (ATARAX/VISTARIL) tablet 25 mg  25 mg Oral TID PRN Haskell Riling, MD      . lamoTRIgine (LAMICTAL) tablet 75 mg  75 mg Oral BID Haskell Riling, MD   75 mg at 04/18/18 0831  . magnesium hydroxide (MILK OF MAGNESIA) suspension 30 mL  30 mL Oral Daily PRN Janos Shampine R, MD      . QUEtiapine (SEROQUEL XR) 24 hr tablet 400 mg  400 mg Oral QHS Tinea Nobile R, MD      . traZODone (DESYREL) tablet 100 mg  100 mg Oral QHS PRN Jaquise Faux, Ileene Hutchinson, MD       PTA Medications: Medications Prior to Admission  Medication Sig Dispense Refill Last Dose  . citalopram (CELEXA) 20 MG tablet Take 30 mg by mouth daily.   Not Taking at Unknown time  . gabapentin (NEURONTIN) 600 MG tablet Take 900 mg by mouth 3 (three) times daily.   Past Week at Unknown time  .  hydrOXYzine (ATARAX/VISTARIL) 25 MG tablet Take 25 mg by mouth 3 (three) times daily as needed for itching.    PRN at PRN  . lamoTRIgine (LAMICTAL) 25 MG tablet Take 75 mg by mouth 2 (two) times daily.  3 Past Week at Unknown time  . QUEtiapine (SEROQUEL XR) 400 MG 24 hr tablet Take 400 mg by mouth at bedtime.   Past Week at Unknown time  . ranitidine (ZANTAC) 150 MG tablet Take 150 mg by mouth as needed for heartburn.   prn at prn    Musculoskeletal: Strength & Muscle Tone: within normal limits Gait & Station: normal Patient leans: N/A  Psychiatric Specialty Exam: Physical Exam  ROS  Blood pressure 103/80, pulse 81, temperature 98 F (36.7 C), temperature source Oral, resp. rate 17, height  (1.753 m), weight 93.9 kg (207 lb), SpO2 95 %.Body mass index is 30.57 kg/m.  General Appearance: Casual  Eye Contact:  Good  Speech:  Clear and Coherent  Volume:  Normal  Mood:  Depressed  Affect:  Constricted  Thought Process:  Coherent and Disorganized  Orientation:  Full (Time, Place, and Person)  Thought Content:  Delusions and Paranoid Ideation  Suicidal Thoughts:  No  Homicidal Thoughts:  No  Memory:  Immediate;   Fair  Judgement:  Impaired   Insight:  Lacking  Psychomotor Activity:  Normal  Concentration:  Concentration: Poor  Recall:  [employer] of Knowledge:  Fair  Language:  Good  Akathisia:  No      Assets:  Resilience  ADL's:  Intact  Cognition:  WNL  Sleep:  Number of Hours: 9    Treatment Plan Summary: 52 yo male admitted due to paranoia and delusions. He has history of delusional disorder and paranoia. He has delusions of reference through interview. He is also abusing stimulants which is liking making symptoms worse. He is on Seroquel. He is very ambivalent about making any medication changes. He denies SI or HI. He is overall organized in thought process.   Plan:  Delusions -Restart Seroquel XR 400 mg qhs. Will move to 8 pm as he feels tired in the morning. He does not want to increase or change at this time -Restart Celexa at 20 mg daily. Discussed changing anti-depressants but he does nto want to at this time -Order EKG  Seizure disorder -Restart Lamictal 75 mg BID  Dispo -Pt will return to his [place] [facility] on discharge. HE will need follow up appointment with Dr. Dorene Sorrow  Observation Level/Precautions:  15 minute checks  Laboratory:  Check Hepatitis C and HIV due to IV drug [place]  Psychotherapy:    Medications:    Consultations:    Discharge Concerns:    Estimated LOS: 3-5 days  Other:     Physician Treatment Plan for Primary Diagnosis: Delusional disorder [facility]) Long Term Goal(s): Improvement in symptoms so as ready for discharge  Short Term Goals: Ability to verbalize feelings will improve    I certify that inpatient services furnished can reasonably be expected to improve the patient's condition.    Haskell Riling, MD 5/16/201911:06 AM

## 2018-04-18 NOTE — Plan of Care (Signed)
Patient stayed in bed and sleeping most of the day.Patient stays anxious and worried.States "sometimes I feel like I am in trouble.People are watching me."Did not attend groups.Compliant with medications.Appetite and energy level good.Support and encouragement given.

## 2018-04-18 NOTE — Progress Notes (Signed)
Recreation Therapy Notes  Date: 04/18/2018  Time: 9:30 am   Location: Craft Room   Behavioral response: N/A   Intervention Topic:  Animal Assisted Therapy  Discussion/Intervention: Patient did not attend group.   Clinical Observations/Feedback:  Patient did not attend group.   Scott West LRT/CTRS        Scott West 04/18/2018 10:35 AM

## 2018-04-18 NOTE — B[facility] Group Notes (Signed)
CSW Group Therapy Note  04/18/2018  Time:  0900  Type of Therapy and Topic: Group Therapy: Goals Group: SMART Goals    Participation Level:  None    Description of Group:   The purpose of a daily goals group is to assist and guide patients in setting recovery/wellness-related goals. The objective is to set goals as they relate to the crisis in which they were admitted. Patients will be using SMART goal modalities to set measurable goals. Characteristics of realistic goals will be discussed and patients will be assisted in setting and processing how one will reach their goal. Facilitator will also assist patients in applying interventions and coping skills learned in psycho-education groups to the SMART goal and process how one will achieve defined goal.    Therapeutic Goals:  -Patients will develop and document one goal related to or their crisis in which brought them into treatment.  -Patients will be guided by LCSW using SMART goal setting modality in how to set a measurable, attainable, realistic and time sensitive goal.  -Patients will process barriers in reaching goal.  -Patients will process interventions in how to overcome and successful in reaching goal.    Patient's Goal:  Pt attended group for the first five minutes, but was asked to leave group to meet with the MD. Pt did not return to group.    Therapeutic Modalities:  Motivational Interviewing  Cognitive Behavioral Therapy  Crisis Intervention Model  SMART goals setting  Heidi Dach, MSW, LCSW Clinical Social Worker 04/18/2018 9:36 AM

## 2018-04-18 NOTE — Plan of Care (Signed)
Isolative in room, irritable

## 2018-04-18 NOTE — B[facility] Group Notes (Signed)
LCSW Group Therapy Note  04/18/2018 1:00 pm  Type of Therapy/Topic:  Group Therapy:  Balance in Life  Participation Level:  Did Not Attend  Description of Group:    This group will address the concept of balance and how it feels and looks when one is unbalanced. Patients will be encouraged to process areas in their lives that are out of balance and identify reasons for remaining unbalanced. Facilitators will guide patients in utilizing problem-solving interventions to address and correct the stressor making their life unbalanced. Understanding and applying boundaries will be explored and addressed for obtaining and maintaining a balanced life. Patients will be encouraged to explore ways to assertively make their unbalanced needs known to significant others in their lives, using other group members and facilitator for support and feedback.  Therapeutic Goals: 1. Patient will identify two or more emotions or situations they have that consume much of in their lives. 2. Patient will identify signs/triggers that life has become out of balance:  3. Patient will identify two ways to set boundaries in order to achieve balance in their lives:  4. Patient will demonstrate ability to communicate their needs through discussion and/or role plays  Summary of Patient Progress:  [place] was invited to today's group, but chose not to attend.    Therapeutic Modalities:   Cognitive Behavioral Therapy Solution-Focused Therapy Assertiveness Training  Alease Frame, [place] 04/18/2018 2:33 PM
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
## 2018-04-18 NOTE — B[facility] Suicide Risk Assessment (Signed)
[facility] Admission Suicide Risk Assessment   Nursing information obtained from:  Patient Demographic factors:  Male Current Mental Status:  NA Loss Factors:  Loss of significant relationship, Financial problems / change in socioeconomic status Historical Factors:  Prior suicide attempts, Family history of mental illness or substance abuse Risk Reduction Factors:  Religious beliefs about death, Sense of responsibility to family, Positive therapeutic relationship  Total Time spent with patient: 1 hour Principal Problem: Delusional disorder (HCC) Diagnosis:   Patient Active Problem List   Diagnosis Date Noted  . Delusional disorder (HCC) [F22] 04/17/2018    Priority: High  . Syncope [R55] 09/22/2017   Subjective Data: See h&P  Continued Clinical Symptoms:  Alcohol Use Disorder Identification Test Final Score (AUDIT): 4 The "Alcohol Use Disorders Identification Test", Guidelines for Use in Primary Care, Second Edition.  World Science writer [facility]). Score between 0-7:  no or low risk or alcohol related problems. Score between 8-15:  moderate risk of alcohol related problems. Score between 16-19:  high risk of alcohol related problems. Score 20 or above:  warrants further diagnostic evaluation for alcohol dependence and treatment.   CLINICAL FACTORS:  Delusional, substance abuse    COGNITIVE FEATURES THAT CONTRIBUTE TO RISK:  None    SUICIDE RISK:   Minimal: No identifiable suicidal ideation.    PLAN OF CARE: See H&P  I certify that inpatient services furnished can reasonably be expected to improve the patient's condition.   Haskell Riling, MD 04/18/2018, 2:47 PM

## 2018-04-18 NOTE — [facility] Group Notes (Signed)
[facility] Group Notes:  (Nursing/MHT/Case Management/Adjunct)  Date:  04/18/2018  Time:  8:57 PM  Type of Therapy:  Group Therapy  Participation Level:  Did Not Attend    Summary of Progress/Problems:  Scott West 04/18/2018, 8:57 PM

## 2018-04-18 NOTE — Plan of Care (Signed)
Pt slept the entire evening. Pt only up momentarily for compliance with medications, otherwise went back to sleep. Pt denies SI/HI. Pt is receptive to treatment and safety maintained on unit. Will continue to monitor.  Problem: Education: Goal: Ability to state activities that reduce stress will improve Outcome: Progressing   Problem: Coping: Goal: Ability to identify and develop effective coping behavior will improve Outcome: Progressing   Problem: Self-Concept: Goal: Ability to identify factors that promote anxiety will improve Outcome: Progressing Goal: Level of anxiety will decrease Outcome: Progressing Goal: Ability to modify response to factors that promote anxiety will improve Outcome: Progressing   Problem: Safety: Goal: Ability to remain free from injury will improve Outcome: Progressing

## 2018-04-18 NOTE — [facility] Suicide Risk Assessment (Signed)
[facility] INPATIENT:  Family/Significant Other Suicide Prevention Education  Suicide Prevention Education:  Patient Refusal for Family/Significant Other Suicide Prevention Education: The patient Scott West has refused to provide written consent for family/significant other to be provided Family/Significant Other Suicide Prevention Education during admission and/or prior to discharge.  Physician notified.  Johny Shears 04/18/2018, 11:00 AM

## 2018-04-19 ENCOUNTER — Encounter: Payer: Self-pay | Admitting: Psychiatry

## 2018-04-19 DIAGNOSIS — F1220 Cannabis dependence, uncomplicated: Secondary | ICD-10-CM | POA: Diagnosis present

## 2018-04-19 DIAGNOSIS — F1420 Cocaine dependence, uncomplicated: Secondary | ICD-10-CM | POA: Diagnosis present

## 2018-04-19 DIAGNOSIS — F17200 Nicotine dependence, unspecified, uncomplicated: Secondary | ICD-10-CM | POA: Diagnosis present

## 2018-04-19 LAB — HEMOGLOBIN A1C
Hgb A1c MFr Bld: 5.7 % — ABNORMAL HIGH (ref 4.8–5.6)
Mean Plasma Glucose: 117 mg/dL

## 2018-04-19 MED ORDER — OLANZAPINE 10 MG PO TABS
10.0000 mg | ORAL_TABLET | Freq: Every day | ORAL | Status: DC
  Administered 2018-04-19 – 2018-04-20 (×2): 10 mg via ORAL
  Filled 2018-04-19 (×2): qty 1

## 2018-04-19 MED ORDER — TRAZODONE HCL 50 MG PO TABS
50.0000 mg | ORAL_TABLET | Freq: Every evening | ORAL | Status: DC | PRN
  Administered 2018-04-20 – 2018-04-24 (×2): 50 mg via ORAL
  Filled 2018-04-19 (×2): qty 1

## 2018-04-19 NOTE — Tx Team (Addendum)
Interdisciplinary Treatment and Diagnostic Plan Update  04/19/2018 Time of Session: 11am [name] MRN: [id number]  Principal Diagnosis: Delusional disorder [facility])  Secondary Diagnoses: Principal Problem:   Delusional disorder (HCC)   Current Medications:  Current Facility-Administered Medications  Medication Dose Route Frequency Provider Last Rate Last Dose  . acetaminophen (TYLENOL) tablet 650 mg  650 mg Oral Q6H PRN McNew, Ileene Hutchinson, MD      . alum & mag hydroxide-simeth (MAALOX/MYLANTA) 200-200-20 MG/5ML suspension 30 mL  30 mL Oral Q4H PRN McNew, Holly R, MD      . atorvastatin (LIPITOR) tablet 40 mg  40 mg Oral Daily McNew, Ileene Hutchinson, MD   40 mg at 04/19/18 0846  . citalopram (CELEXA) tablet 20 mg  20 mg Oral Daily McNew, Ileene Hutchinson, MD   20 mg at 04/19/18 0847  . gabapentin (NEURONTIN) tablet 900 mg  900 mg Oral TID Haskell Riling, MD   900 mg at 04/19/18 0846  . hydrOXYzine (ATARAX/VISTARIL) tablet 25 mg  25 mg Oral TID PRN Haskell Riling, MD      . lamoTRIgine (LAMICTAL) tablet 75 mg  75 mg Oral BID Haskell Riling, MD   75 mg at 04/19/18 0847  . magnesium hydroxide (MILK OF MAGNESIA) suspension 30 mL  30 mL Oral Daily PRN McNew, Ileene Hutchinson, MD      . OLANZapine (ZYPREXA) tablet 10 mg  10 mg Oral QHS McNew, Holly R, MD      . traZODone (DESYREL) tablet 50 mg  50 mg Oral QHS PRN McNew, Ileene Hutchinson, MD       PTA Medications: Medications Prior to Admission  Medication Sig Dispense Refill Last Dose  . citalopram (CELEXA) 20 MG tablet Take 30 mg by mouth daily.   Not Taking at Unknown time  . gabapentin (NEURONTIN) 600 MG tablet Take 900 mg by mouth 3 (three) times daily.   Past Week at Unknown time  . hydrOXYzine (ATARAX/VISTARIL) 25 MG tablet Take 25 mg by mouth 3 (three) times daily as needed for itching.    PRN at PRN  . lamoTRIgine (LAMICTAL) 25 MG tablet Take 75 mg by mouth 2 (two) times daily.  3 Past Week at Unknown time  . QUEtiapine (SEROQUEL XR) 400 MG 24 hr tablet Take 400 mg by  mouth at bedtime.   Past Week at Unknown time  . ranitidine (ZANTAC) 150 MG tablet Take 150 mg by mouth as needed for heartburn.   prn at prn    Patient Stressors: Financial difficulties Marital or family conflict  Patient Strengths: General fund of knowledge Motivation for treatment/growth Religious Affiliation Supportive family/friends  Treatment Modalities: Medication Management, Group therapy, Case management,  1 to 1 session with clinician, Psychoeducation, Recreational therapy.   Physician Treatment Plan for Primary Diagnosis: Delusional disorder [facility]) Long Term Goal(s): Improvement in symptoms so as ready for discharge   Short Term Goals: Ability to verbalize feelings will improve  Medication Management: Evaluate patient's response, side effects, and tolerance of medication regimen.  Therapeutic Interventions: 1 to 1 sessions, Unit Group sessions and Medication administration.  Evaluation of Outcomes: Progressing  Physician Treatment Plan for Secondary Diagnosis: Principal Problem:   Delusional disorder (HCC)  Long Term Goal(s): Improvement in symptoms so as ready for discharge   Short Term Goals: Ability to verbalize feelings will improve     Medication Management: Evaluate patient's response, side effects, and tolerance of medication regimen.  Therapeutic Interventions: 1 to 1 sessions, Unit Group sessions and  Medication administration.  Evaluation of Outcomes: Progressing   RN Treatment Plan for Primary Diagnosis: Delusional disorder (HCC) Long Term Goal(s): Knowledge of disease and therapeutic regimen to maintain health will improve  Short Term Goals: Ability to participate in decision making will improve, Ability to disclose and discuss suicidal ideas, Ability to identify and develop effective coping behaviors will improve and Compliance with prescribed medications will improve  Medication Management: RN will administer medications as ordered by provider,  will assess and evaluate patient's response and provide education to patient for prescribed medication. RN will report any adverse and/or side effects to prescribing provider.  Therapeutic Interventions: 1 on 1 counseling sessions, Psychoeducation, Medication administration, Evaluate responses to treatment, Monitor vital signs and CBGs as ordered, Perform/monitor CIWA, COWS, AIMS and Fall Risk screenings as ordered, Perform wound care treatments as ordered.  Evaluation of Outcomes: Progressing   LCSW Treatment Plan for Primary Diagnosis: Delusional disorder [facility]) Long Term Goal(s): Safe transition to appropriate next level of care at discharge, Engage patient in therapeutic group addressing interpersonal concerns.  Short Term Goals: Engage patient in aftercare planning with referrals and resources, Increase social support, Facilitate acceptance of mental health diagnosis and concerns, Identify triggers associated with mental health/substance abuse issues and Increase skills for wellness and recovery  Therapeutic Interventions: Assess for all discharge needs, 1 to 1 time with Social worker, Explore available resources and support systems, Assess for adequacy in community support network, Educate family and significant other(s) on suicide prevention, Complete Psychosocial Assessment, Interpersonal group therapy.  Evaluation of Outcomes: Progressing   Progress in Treatment: Attending groups: No. Participating in groups: No. Taking medication as prescribed: Yes. Toleration medication: Yes. Family/Significant other contact made: No, will contact:  Patient refused Patient understands diagnosis: Yes. Discussing patient identified problems/goals with staff: Yes. Medical problems stabilized or resolved: Yes. Denies suicidal/homicidal ideation: Yes. Issues/concerns per patient self-inventory: No. Other:   New problem(s) identified: No, Describe:  None  New Short Term/Long Term Goal(s): "To get  from day to day, get caught up on my bills and to speak with my daughter."  Discharge Plan or Barriers: To discharge to an [place] house and follow up with outpatient treatment.  Reason for Continuation of Hospitalization: Delusions  Medication stabilization  Estimated Length of Stay: 3-5 days  Recreational Therapy: Patient Stressors: Friends Patient Goal: Patient will successfully identify 2 ways of making healthy decisions post d/c within 5 recreation therapy group sessions  Attendees: Patient: Scott West 04/19/2018 11:05 AM  Physician: Corinna Gab, MD 04/19/2018 11:05 AM  Nursing:  04/19/2018 11:05 AM  RN Care Manager: 04/19/2018 11:05 AM  Social Worker: Johny Shears, LCSWA 04/19/2018 11:05 AM  Recreational Therapist: Danella Deis. Claudina Oliphant CTRS, LRT 04/19/2018 11:05 AM  Other: Heidi Dach, LCSW 04/19/2018 11:05 AM  Other: Matilde Bash, LCSW 04/19/2018 11:05 AM  Other: 04/19/2018 11:05 AM    Scribe for Treatment Team: Johny Shears, LCSW 04/19/2018 11:05 AM

## 2018-04-19 NOTE — Progress Notes (Signed)
[facility] MD Progress Note  04/19/2018 12:36 PM Scott West  MRN:  [id number] Subjective:  Pt has been isolative to his room. He states that he needs to get out of the hospital because h has bills to pay and to get back to work. He is still paranoid and making odd connections with things. He feels that he always makes the wrong decisions and feels that "there will be consequences for whatever I choose." He has difficult even making a decision about follow up or [facility]. We discuss going to AA or NA. He states taht he felt weird there because he felt like people were saying things that were pertaining to him and was paranoid about this. He feels like people are always watching him and what he does. Sometimes he feels like cameras are in his Zenaida Niece. He states, "I'm usually about to ignore it but sometimes I can't." He denies SI or HI. He states that he wants to get his own place when he can save some money. He does not really want to do SAIOP because he has a job. He is anxious and significantly worrying. He states that he feels li ke a burden to ask staff for anything because "I feel like I'm a liability to everyone I talk to." He wants to shower today and is going to ask staff for shower supplies. He is open to making a medication change. He denies that he has tried other medications in the past.   Principal Problem: Delusional disorder (HCC) Diagnosis:   Patient Active Problem List   Diagnosis Date Noted  . Delusional disorder (HCC) [F22] 04/17/2018    Priority: High  . Syncope [R55] 09/22/2017   Total Time spent with patient: 20 minutes  Past Psychiatric History: See H&P  Past Medical History:  Past Medical History:  Diagnosis Date  . Anxiety   . Asthma   . Psychosis [facility])     Past Surgical History:  Procedure Laterality Date  . HERNIA REPAIR     Family History:  Family History  Problem Relation Age of Onset  . Hypertension Mother   . Heart disease Father    Family Psychiatric   History: See H&P Social History:  Social History   Substance and Sexual Activity  Alcohol Use Yes   Comment: former 2007     Social History   Substance and Sexual Activity  Drug Use Yes  . Types: Marijuana   Comment: not for 1 year    Social History   Socioeconomic History  . Marital status: Legally Separated    Spouse name: Not on file  . Number of children: Not on file  . Years of education: Not on file  . Highest education level: Not on file  Occupational History  . Not on file  Social Needs  . Financial resource strain: Not on file  . Food insecurity:    Worry: Not on file    Inability: Not on file  . Transportation needs:    Medical: Not on file    Non-medical: Not on file  Tobacco Use  . Smoking status: Current Every Day Smoker    Types: E-cigarettes  . Smokeless tobacco: Never Used  Substance and Sexual Activity  . Alcohol use: Yes    Comment: former 2007  . Drug use: Yes    Types: Marijuana    Comment: not for 1 year  . Sexual activity: Not on file  Lifestyle  . Physical activity:    Days per  week: Not on file    Minutes per session: Not on file  . Stress: Not on file  Relationships  . Social connections:    Talks on phone: Not on file    Gets together: Not on file    Attends religious service: Not on file    Active member of club or organization: Not on file    Attends meetings of clubs or organizations: Not on file    Relationship status: Not on file  Other Topics Concern  . Not on file  Social History Narrative  . Not on file   Additional Social History:    Pain Medications: see mar Prescriptions: see mar Over the Counter: see mar History of alcohol / drug use?: Yes Longest period of sobriety (when/how long): unknown Negative Consequences of Use: Financial, Personal relationships   Name of Substance 2: alcohol 2 - Age of First Use: 21 2 - Amount (size/oz): varies 2 - Frequency: pt reports that he used alcohol on 04/15/18, last use  prior was a month ago 2 - Duration: varies 2 - Last Use / Amount: yesterday Name of Substance 3: cocaine 3 - Age of First Use: 20 3 - Amount (size/oz): varies 3 - Frequency: pt reports that he used cocaine  on 04/15/18, last use prior was a month ago 3 - Duration: sporadic use 3 - Last Use / Amount: 04/16/15              Sleep: Fair  Appetite:  Fair  Current Medications: Current Facility-Administered Medications  Medication Dose Route Frequency Provider Last Rate Last Dose  . acetaminophen (TYLENOL) tablet 650 mg  650 mg Oral Q6H PRN Jamauri Kruzel, Ileene Hutchinson, MD      . alum & mag hydroxide-simeth (MAALOX/MYLANTA) 200-200-20 MG/5ML suspension 30 mL  30 mL Oral Q4H PRN Codi Kertz R, MD      . atorvastatin (LIPITOR) tablet 40 mg  40 mg Oral Daily Justinian Miano, Ileene Hutchinson, MD   40 mg at 04/19/18 0846  . citalopram (CELEXA) tablet 20 mg  20 mg Oral Daily Sheriden Archibeque, Ileene Hutchinson, MD   20 mg at 04/19/18 0847  . gabapentin (NEURONTIN) tablet 900 mg  900 mg Oral TID Haskell Riling, MD   900 mg at 04/19/18 1234  . hydrOXYzine (ATARAX/VISTARIL) tablet 25 mg  25 mg Oral TID PRN Haskell Riling, MD      . lamoTRIgine (LAMICTAL) tablet 75 mg  75 mg Oral BID Haskell Riling, MD   75 mg at 04/19/18 0847  . magnesium hydroxide (MILK OF MAGNESIA) suspension 30 mL  30 mL Oral Daily PRN Kameah Rawl, Ileene Hutchinson, MD      . OLANZapine (ZYPREXA) tablet 10 mg  10 mg Oral QHS Madelaine Whipple, Ileene Hutchinson, MD      . traZODone (DESYREL) tablet 50 mg  50 mg Oral QHS PRN Josephyne Tarter, Ileene Hutchinson, MD        Lab Results:  Results for orders placed or performed during the hospital encounter of 04/17/18 (from the past 48 hour(s))  Hemoglobin A1c     Status: Abnormal   Collection Time: 04/18/18  7:06 AM  Result Value Ref Range   Hgb A1c MFr Bld 5.7 (H) 4.8 - 5.6 %    Comment: (NOTE)         Prediabetes: 5.7 - 6.4         Diabetes: >6.4         Glycemic control for adults with diabetes: <7.0  Mean Plasma Glucose 117 mg/dL    Comment: (NOTE) Performed At: [facility] [address] [place], [place] [id number] Jolene Schimke MD WU:[id number] Performed at [facility], [address] [place], [place] [address]   Lipid panel     Status: Abnormal   Collection Time: 04/18/18  7:06 AM  Result Value Ref Range   Cholesterol 225 (H) 0 - 200 mg/dL   Triglycerides 77 <308 mg/dL   HDL 58 >65 mg/dL   Total CHOL/HDL Ratio 3.9 RATIO   VLDL 15 0 - 40 mg/dL   LDL Cholesterol 784 (H) 0 - 99 mg/dL    Comment:        Total Cholesterol/HDL:CHD Risk Coronary Heart Disease Risk Table                     Men   Women  1/2 Average Risk   3.4   3.3  Average Risk       5.0   4.4  2 X Average Risk   9.6   7.1  3 X Average Risk  23.4   11.0        Use the calculated Patient Ratio above and the CHD Risk Table to determine the patient's CHD Risk.        ATP III CLASSIFICATION (LDL):  <100     mg/dL   Optimal  696-295  mg/dL   Near or Above                    Optimal  130-159  mg/dL   Borderline  284-132  mg/dL   High  >440     mg/dL   Very High Performed at [facility], [address] [place], [place] [address]   TSH     Status: None   Collection Time: 04/18/18  7:06 AM  Result Value Ref Range   TSH 1.280 0.350 - 4.500 uIU/mL    Comment: Performed by a 3rd Generation assay with a functional sensitivity of <=0.01 uIU/mL. Performed at [facility], [address] [place], [place] [address]     Blood Alcohol level:  Lab Results  Component Value Date   [facility] <10 04/16/2018   ETH <10 09/22/2017    Metabolic Disorder Labs: Lab Results  Component Value Date   HGBA1C 5.7 (H) 04/18/2018   MPG 117 04/18/2018   No results found for: PROLACTIN Lab Results  Component Value Date   CHOL 225 (H) 04/18/2018   TRIG 77 04/18/2018   HDL 58 04/18/2018   CHOLHDL 3.9 04/18/2018   VLDL 15 04/18/2018   LDLCALC 152 (H) 04/18/2018    Physical Findings: AIMS: Facial and Oral Movements Muscles of Facial Expression: None,  normal Lips and Perioral Area: None, normal Jaw: None, normal Tongue: None, normal,Extremity Movements Upper (arms, wrists, hands, fingers): None, normal Lower (legs, knees, ankles, toes): None, normal, Trunk Movements Neck, shoulders, hips: None, normal, Overall Severity Severity of abnormal movements (highest score from questions above): None, normal Incapacitation due to abnormal movements: None, normal Patient's awareness of abnormal movements (rate only patient's report): No Awareness, Dental Status Current problems with teeth and/or dentures?: No Does patient usually wear dentures?: No  CIWA:  CIWA-Ar Total: 3 COWS:  COWS Total Score: 0  Musculoskeletal: Strength & Muscle Tone: within normal limits Gait & Station: normal Patient leans: N/A  Psychiatric Specialty Exam: Physical Exam  Nursing note and vitals reviewed.   Review of Systems  All  other systems reviewed and are negative.   Blood pressure 109/75, pulse 83, temperature 97.9 F (36.6 C), temperature source Oral, resp. rate 18, height  (1.753 m), weight 93.9 kg (207 lb), SpO2 98 %.Body mass index is 30.57 kg/m.  General Appearance: Disheveled  Eye Contact:  Fair  Speech:  Pressured  Volume:  Normal  Mood:  Anxious  Affect:  Congruent  Thought Process:  Disorganized  Orientation:  Full (Time, Place, and Person)  Thought Content:  Delusions  Suicidal Thoughts:  No  Homicidal Thoughts:  No  Memory:  Immediate;   Fair  Judgement:  Impaired  Insight:  Lacking  Psychomotor Activity:  Normal  Concentration:  Concentration: Poor  Recall:  [employer] of Knowledge:  Fair  Language:  Fair  Akathisia:  No      Assets:  Resilience  ADL's:  Intact  Cognition:  WNL  Sleep:  Number of Hours: 8     Treatment Plan Summary: 52 yo male admitted due to paranoia and bizzare behaviors. He likely has underlying psychotic disorder (possible delusional disorder) but has also been using cocaine regularly which  complicates the picture. He is paranoid and is obviously causing him distress and anxiety. He is not necessarily a danger to himself or others but is very anxious about odd connections that he is making and feeling like people are talking and listening in on him. He is open to a medication change as he is feeling sedated on Seroquel and not likely on high enough dose to target paranoia  Plan;  Psychosis -Stop Seroquel -Start Zyprexa 10 mg qhs. Increase as tolerated  Depression -Continue Celexa 20 mg daily  Seizure disorder -Continue Lamictal 75 mg BID  Dispo -CSW gave list of [facility] that he will look into.   Haskell Riling, MD 04/19/2018, 12:36 PM

## 2018-04-19 NOTE — Plan of Care (Signed)
Patient stayed in bed most of the day.Minimal interactions with peers and staff.Affect is anxious and continues to have paranoid thoughts states "people are watching me."Compliant with medications.Did not attend groups.Appetite and energy level good.support and encouragement given.

## 2018-04-19 NOTE — [facility] Group Notes (Signed)

## 2018-04-19 NOTE — Progress Notes (Signed)
Recreation Therapy Notes  INPATIENT RECREATION THERAPY ASSESSMENT  Patient Details Name: Tallan Sandoz MRN: [id number] DOB: 1966-08-01 Today's Date: 04/19/2018       Information Obtained From: Patient  Able to Participate in Assessment/Interview: Yes  Patient Presentation:    Reason for Admission (Per Patient): Other (Comments)(Decision making)  Patient Stressors: Friends  Coping Skills:   Talk, Prayer, Read, Other (Comment)(Call help numbers)  Leisure Interests (2+):  Individual - Reading  Frequency of Recreation/Participation:    Awareness of Community Resources:     [employer]:     Current Use:    If no, Barriers?:    Expressed Interest in [employer] Information:    [place] of Residence:  Film/video editor  Patient Main Form of Transportation: Set designer  Patient Strengths:  Non confrontational   Patient Identified Areas of Improvement:  Not read so much into everything  Patient Goal for Hospitalization:  To get from day to day  Current SI (including self-harm):  No  Current HI:  No  Current AVH: No  Staff Intervention Plan: Group Attendance, Collaborate with Interdisciplinary Treatment Team  Consent to Intern Participation: N/A  Ahlam Piscitelli 04/19/2018, 12:36 PM

## 2018-04-19 NOTE — Progress Notes (Signed)
Patient slept 8 hours. No apparent distress. Will endorse care to oncoming shift.

## 2018-04-19 NOTE — Progress Notes (Signed)
Recreation Therapy Notes   Date: 04/19/2018  Time: 9:30 am   Location: Craft Room   Behavioral response: N/A   Intervention Topic:  Communication  Discussion/Intervention: Patient did not attend group.   Clinical Observations/Feedback:  Patient did not attend group.   Romari Gasparro LRT/CTRS        Lela Gell 04/19/2018 9:59 AM

## 2018-04-20 DIAGNOSIS — B1920 Unspecified viral hepatitis C without hepatic coma: Secondary | ICD-10-CM

## 2018-04-20 LAB — HIV ANTIBODY (ROUTINE TESTING W REFLEX): HIV SCREEN 4TH GENERATION: NONREACTIVE

## 2018-04-20 LAB — HEPATITIS C ANTIBODY: HCV Ab: >11 s/co ratio — ABNORMAL HIGH (ref 0.0–0.9)

## 2018-04-20 MED ORDER — OLANZAPINE 10 MG PO TABS
20.0000 mg | ORAL_TABLET | Freq: Every day | ORAL | Status: DC
  Administered 2018-04-21 – 2018-04-24 (×4): 20 mg via ORAL
  Filled 2018-04-20 (×4): qty 2

## 2018-04-20 NOTE — Plan of Care (Signed)
Has remained isolative in room, restless, irritable

## 2018-04-20 NOTE — Progress Notes (Signed)
Patient stayed in room isolative, anxious and restless. Irritable and guarded. Did not attend any evening activities. Reported that he is depressed and tired. Requested medications to be taken to him in room. Patient received medications and requested to be left alone "I am trying to get some sleep, I am tired". Patient currently sleeping and staff continue to monitor for safety.

## 2018-04-20 NOTE — Progress Notes (Signed)
[facility] MD Progress Note  04/20/2018 11:43 PM Scott West  MRN:  [id number]  Subjective:    Scott West is very disorganized in his thinking and paranoid. I do not believe he can take care of himself. He thinks he needs to be in court on Monday but does not remember for what. If thisis divorce case he has no lawyer and can't handle it. He was toled that he has HepC and feels overwhelmed.  Principal Problem: Delusional disorder [facility]) Diagnosis:   Patient Active Problem List   Diagnosis Date Noted  . Delusional disorder (HCC) [F22] 04/17/2018    Priority: High  . Cannabis use disorder, moderate, dependence (HCC) [F12.20] 04/19/2018  . Tobacco use disorder [F17.200] 04/19/2018  . Cocaine use disorder, moderate, dependence (HCC) [F14.20] 04/19/2018  . Syncope [R55] 09/22/2017   Total Time spent with patient: 20 minutes  Past Psychiatric History: paranoia  Past Medical History:  Past Medical History:  Diagnosis Date  . Anxiety   . Asthma   . Psychosis [facility])     Past Surgical History:  Procedure Laterality Date  . HERNIA REPAIR     Family History:  Family History  Problem Relation Age of Onset  . Hypertension Mother   . Heart disease Father    Family Psychiatric  History: none Social History:  Social History   Substance and Sexual Activity  Alcohol Use Yes   Comment: former 2007     Social History   Substance and Sexual Activity  Drug Use Yes  . Types: Marijuana   Comment: not for 1 year    Social History   Socioeconomic History  . Marital status: Legally Separated    Spouse name: Not on file  . Number of children: Not on file  . Years of education: Not on file  . Highest education level: Not on file  Occupational History  . Not on file  Social Needs  . Financial resource strain: Not on file  . Food insecurity:    Worry: Not on file    Inability: Not on file  . Transportation needs:    Medical: Not on file    Non-medical: Not on file  Tobacco Use  .  Smoking status: Current Every Day Smoker    Types: E-cigarettes  . Smokeless tobacco: Never Used  Substance and Sexual Activity  . Alcohol use: Yes    Comment: former 2007  . Drug use: Yes    Types: Marijuana    Comment: not for 1 year  . Sexual activity: Not on file  Lifestyle  . Physical activity:    Days per week: Not on file    Minutes per session: Not on file  . Stress: Not on file  Relationships  . Social connections:    Talks on phone: Not on file    Gets together: Not on file    Attends religious service: Not on file    Active member of club or organization: Not on file    Attends meetings of clubs or organizations: Not on file    Relationship status: Not on file  Other Topics Concern  . Not on file  Social History Narrative  . Not on file   Additional Social History:    Pain Medications: see mar Prescriptions: see mar Over the Counter: see mar History of alcohol / drug use?: Yes Longest period of sobriety (when/how long): unknown Negative Consequences of Use: Financial, Personal relationships   Name of Substance 2: alcohol 2 - Age  of First Use: 21 2 - Amount (size/oz): varies 2 - Frequency: pt reports that he used alcohol on 04/15/18, last use prior was a month ago 2 - Duration: varies 2 - Last Use / Amount: yesterday Name of Substance 3: cocaine 3 - Age of First Use: 20 3 - Amount (size/oz): varies 3 - Frequency: pt reports that he used cocaine  on 04/15/18, last use prior was a month ago 3 - Duration: sporadic use 3 - Last Use / Amount: 04/16/15              Sleep: Fair  Appetite:  Fair  Current Medications: Current Facility-Administered Medications  Medication Dose Route Frequency Provider Last Rate Last Dose  . acetaminophen (TYLENOL) tablet 650 mg  650 mg Oral Q6H PRN Haskell Riling, MD   650 mg at 04/20/18 1655  . alum & mag hydroxide-simeth (MAALOX/MYLANTA) 200-200-20 MG/5ML suspension 30 mL  30 mL Oral Q4H PRN McNew, Holly R, MD      .  atorvastatin (LIPITOR) tablet 40 mg  40 mg Oral Daily McNew, Ileene Hutchinson, MD   40 mg at 04/20/18 0840  . citalopram (CELEXA) tablet 20 mg  20 mg Oral Daily McNew, Ileene Hutchinson, MD   20 mg at 04/20/18 0841  . gabapentin (NEURONTIN) tablet 900 mg  900 mg Oral TID Haskell Riling, MD   900 mg at 04/20/18 1643  . hydrOXYzine (ATARAX/VISTARIL) tablet 25 mg  25 mg Oral TID PRN Haskell Riling, MD   25 mg at 04/20/18 0852  . lamoTRIgine (LAMICTAL) tablet 75 mg  75 mg Oral BID Haskell Riling, MD   75 mg at 04/20/18 2228  . magnesium hydroxide (MILK OF MAGNESIA) suspension 30 mL  30 mL Oral Daily PRN McNew, Holly R, MD      . OLANZapine (ZYPREXA) tablet 10 mg  10 mg Oral QHS McNew, Ileene Hutchinson, MD   10 mg at 04/20/18 2228  . traZODone (DESYREL) tablet 50 mg  50 mg Oral QHS PRN McNew, Ileene Hutchinson, MD   50 mg at 04/20/18 2228    Lab Results: No results found for this or any previous visit (from the past 48 hour(s)).  Blood Alcohol level:  Lab Results  Component Value Date   ETH <10 04/16/2018   ETH <10 09/22/2017    Metabolic Disorder Labs: Lab Results  Component Value Date   HGBA1C 5.7 (H) 04/18/2018   MPG 117 04/18/2018   No results found for: PROLACTIN Lab Results  Component Value Date   CHOL 225 (H) 04/18/2018   TRIG 77 04/18/2018   HDL 58 04/18/2018   CHOLHDL 3.9 04/18/2018   VLDL 15 04/18/2018   LDLCALC 152 (H) 04/18/2018    Physical Findings: AIMS: Facial and Oral Movements Muscles of Facial Expression: None, normal Lips and Perioral Area: None, normal Jaw: None, normal Tongue: None, normal,Extremity Movements Upper (arms, wrists, hands, fingers): None, normal Lower (legs, knees, ankles, toes): None, normal, Trunk Movements Neck, shoulders, hips: None, normal, Overall Severity Severity of abnormal movements (highest score from questions above): None, normal Incapacitation due to abnormal movements: None, normal Patient's awareness of abnormal movements (rate only patient's report): No  Awareness, Dental Status Current problems with teeth and/or dentures?: No Does patient usually wear dentures?: No  CIWA:  CIWA-Ar Total: 3 COWS:  COWS Total Score: 0  Musculoskeletal: Strength & Muscle Tone: within normal limits Gait & Station: normal Patient leans: N/A  Psychiatric Specialty Exam: Physical Exam  Nursing  note and vitals reviewed. Psychiatric: His affect is inappropriate. His speech is rapid and/or pressured and delayed. He is slowed and withdrawn. Thought content is paranoid and delusional. Cognition and memory are impaired. He expresses impulsivity.    Review of Systems  Neurological: Negative.   Psychiatric/Behavioral: Positive for hallucinations and substance abuse.  All other systems reviewed and are negative.   Blood pressure 134/79, pulse (!) 52, temperature 97.7 F (36.5 C), temperature source Oral, resp. rate 18, height  (1.753 m), weight 93.9 kg (207 lb), SpO2 96 %.Body mass index is 30.57 kg/m.  General Appearance: Disheveled  Eye Contact:  Fair  Speech:  Clear and Coherent  Volume:  Decreased  Mood:  Anxious and Irritable  Affect:  Congruent  Thought Process:  Goal Directed and Descriptions of Associations: Tangential  Orientation:  Full (Time, Place, and Person)  Thought Content:  Delusions and Paranoid Ideation  Suicidal Thoughts:  No  Homicidal Thoughts:  No  Memory:  Immediate;   Poor Recent;   Poor Remote;   Poor  Judgement:  Poor  Insight:  Lacking  Psychomotor Activity:  Psychomotor Retardation  Concentration:  Concentration: Poor and Attention Span: Poor  Recall:  Poor  Fund of Knowledge:  Poor  Language:  Poor  Akathisia:  No  Handed:  Right  AIMS (if indicated):     Assets:  Communication Skills Desire for Improvement Resilience  ADL's:  Intact  Cognition:  WNL  Sleep:  Number of Hours: 8     Treatment Plan Summary: Daily contact with patient to assess and evaluate symptoms and progress in treatment and Medication  management   52 yo male admitted due to paranoia and bizzare behaviors. He likely has underlying psychotic disorder (possible delusional disorder) but has also been using cocaine regularly which complicates the picture. He is paranoid and is obviously causing him distress and anxiety. He is not necessarily a danger to himself or others but is very anxious about odd connections that he is making and feeling like people are talking and listening in on him. He is open to a medication change as he is feeling sedated on Seroquel and not likely on high enough dose to target paranoia  Plan;  Psychosis -Stop Seroquel -Start Zyprexa 10 mg qhs. Increase as tolerated  Depression -Continue Celexa 20 mg daily  Seizure disorder -Continue Lamictal 75 mg BID  Dispo -CSW gave list of [facility] that he will look into  5/18 We will increase Zyprexa to 20 mg. Patient is HepC positive.     Kristine Linea, MD 04/20/2018, 11:43 PM

## 2018-04-20 NOTE — Progress Notes (Signed)
Received Scott West this AM in his room,awaken for breakfast and refused lunch. He endorsed feeling depressed and anxious this AM and worried about his lab results.He denied feeling suicidal this AM. His affect is flat. He admited he over think situations. He has been compliant with his medications.

## 2018-04-20 NOTE — [facility] Group Notes (Signed)
LCSW Group Therapy Note  04/20/2018 1:15pm  Type of Therapy and Topic:  Group Therapy:  Fears and Unhealthy Coping Skills  Participation Level:  Active   Description of Group:  The focus of this group was to discuss some of the prevalent fears that patients experience, and to identify the commonalities among group members.  An exercise was used to initiate the discussion, followed by writing on the white board a group-generated list of unhealthy coping and healthy coping techniques to deal with each fear.    Therapeutic Goals: 1. Patient will identify and describe 3 fears they experience 2. Patient will identify one positive coping strategy for each fear they experience 3. Patient will respond empathically to peers statements regarding fears they experience  Summary of Patient Progress:  Patient scored his mood between a 3 and a 4 (10 best.) Pt discussed with the group some the prevalent fears he is experiencing, and was able to identify commonalities among group members. Pt stated "the government is out to get me and my wife" "I am afraid of the future." Pt reported he does not know how to deal with his fears. Pt reported that he is learning to talk about his problems with people he can trust. Pt was able to shared with the group and provide feedback to other group members.           Therapeutic Modalities Cognitive Behavioral Therapy Motivational Interviewing  Chelsy Parrales  CUEBAS-COLON, LCSW 04/20/2018 12:56 PM

## 2018-04-21 NOTE — Plan of Care (Signed)
Patient is stable and secure in the unit, encourage patient to be out of his room for socialization with peers, educate patient on  safety and nutrition, understands information given to him. Denies any SI/HI and no signs of AVH. Took his medications without any issues and no noticeable reactions.15 minute safety rounding is in progress no distress. Problem: Education: Goal: Ability to state activities that reduce stress will improve Outcome: Progressing   Problem: Coping: Goal: Ability to identify and develop effective coping behavior will improve Outcome: Progressing   Problem: Self-Concept: Goal: Ability to identify factors that promote anxiety will improve Outcome: Progressing Goal: Level of anxiety will decrease Outcome: Progressing Goal: Ability to modify response to factors that promote anxiety will improve Outcome: Progressing   Problem: Education: Goal: Knowledge of disease or condition will improve Outcome: Progressing Goal: Understanding of discharge needs will improve Outcome: Progressing   Problem: Health Behavior/Discharge Planning: Goal: Ability to identify changes in lifestyle to reduce recurrence of condition will improve Outcome: Progressing Goal: Identification of resources available to assist in meeting health care needs will improve Outcome: Progressing   Problem: Safety: Goal: Ability to remain free from injury will improve Outcome: Progressing   Problem: Elimination: Goal: Will not experience complications related to bowel motility Outcome: Progressing

## 2018-04-21 NOTE — Progress Notes (Signed)
Received Scott West this AM after breakfast, he arrived to the medication room and was compliant. He stated feeling better to and verbalized being less depressed and the anxiety increases at intervals. His affect is brighter this morning and   he denied feeling suicidal; if fact he stated wanting to regain control of his life. He went outside in the courtyard and ambulating in the hallway. We discussed and encouraged him to attend a group therapy session today.

## 2018-04-21 NOTE — [facility] Group Notes (Signed)
[facility] Group Notes:  (Nursing/MHT/Case Management/Adjunct)  Date:  04/21/2018  Time:  9:28 PM  Type of Therapy:  Group Therapy  Participation Level:  Active  Participation Quality:  Appropriate  Affect:  Appropriate  Cognitive:  Appropriate  Insight:  Appropriate  Engagement in Group:  Engaged  Modes of Intervention:  Discussion  Summary of Progress/Problems:  Jinger Neighbors 04/21/2018, 9:28 PM

## 2018-04-21 NOTE — Progress Notes (Addendum)
[facility] MD Progress Note  04/21/2018 1:11 PM Scott West  MRN:  [id number]  Subjective:   Scott West was terribly disorganized and paranoid yesterday. He seems a little better today. He took a shower and wears a clean shirt. He still makes little sense and changes his story. He is still confused if he needs to be in court tomorrow. One minute he wants to stay in his [employer] Niece, go to a boarding house, rent an apartment, live in a shop or go to [employer]. He has been watching birds through his window and believes that they are following him into the hospital.   Had been using alcohol, cocaine and opioids for 4 months Nov-April, was clean for a month, and relapsed on the weekend of admission. He now considers going to rehab. He is devastated by the news about hepatitis. Wants to come clean and get hepatitis treatmen.  Principal Problem: Delusional disorder [facility]) Diagnosis:   Patient Active Problem List   Diagnosis Date Noted  . Delusional disorder (HCC) [F22] 04/17/2018    Priority: High  . Cannabis use disorder, moderate, dependence (HCC) [F12.20] 04/19/2018  . Tobacco use disorder [F17.200] 04/19/2018  . Cocaine use disorder, moderate, dependence (HCC) [F14.20] 04/19/2018  . Syncope [R55] 09/22/2017   Total Time spent with patient: 20 minutes  Past Psychiatric History: paranoia, substance use  Past Medical History:  Past Medical History:  Diagnosis Date  . Anxiety   . Asthma   . Psychosis [facility])     Past Surgical History:  Procedure Laterality Date  . HERNIA REPAIR     Family History:  Family History  Problem Relation Age of Onset  . Hypertension Mother   . Heart disease Father    Family Psychiatric  History: see H&P Social History:  Social History   Substance and Sexual Activity  Alcohol Use Yes   Comment: former 2007     Social History   Substance and Sexual Activity  Drug Use Yes  . Types: Marijuana   Comment: not for 1 year    Social History   Socioeconomic  History  . Marital status: Legally Separated    Spouse name: Not on file  . Number of children: Not on file  . Years of education: Not on file  . Highest education level: Not on file  Occupational History  . Not on file  Social Needs  . Financial resource strain: Not on file  . Food insecurity:    Worry: Not on file    Inability: Not on file  . Transportation needs:    Medical: Not on file    Non-medical: Not on file  Tobacco Use  . Smoking status: Current Every Day Smoker    Types: E-cigarettes  . Smokeless tobacco: Never Used  Substance and Sexual Activity  . Alcohol use: Yes    Comment: former 2007  . Drug use: Yes    Types: Marijuana    Comment: not for 1 year  . Sexual activity: Not on file  Lifestyle  . Physical activity:    Days per week: Not on file    Minutes per session: Not on file  . Stress: Not on file  Relationships  . Social connections:    Talks on phone: Not on file    Gets together: Not on file    Attends religious service: Not on file    Active member of club or organization: Not on file    Attends meetings of clubs or organizations: Not  on file    Relationship status: Not on file  Other Topics Concern  . Not on file  Social History Narrative  . Not on file   Additional Social History:    Pain Medications: see mar Prescriptions: see mar Over the Counter: see mar History of alcohol / drug use?: Yes Longest period of sobriety (when/how long): unknown Negative Consequences of Use: Financial, Personal relationships   Name of Substance 2: alcohol 2 - Age of First Use: 21 2 - Amount (size/oz): varies 2 - Frequency: pt reports that he used alcohol on 04/15/18, last use prior was a month ago 2 - Duration: varies 2 - Last Use / Amount: yesterday Name of Substance 3: cocaine 3 - Age of First Use: 20 3 - Amount (size/oz): varies 3 - Frequency: pt reports that he used cocaine  on 04/15/18, last use prior was a month ago 3 - Duration: sporadic  use 3 - Last Use / Amount: 04/16/15              Sleep: Fair  Appetite:  Fair  Current Medications: Current Facility-Administered Medications  Medication Dose Route Frequency Provider Last Rate Last Dose  . acetaminophen (TYLENOL) tablet 650 mg  650 mg Oral Q6H PRN Haskell Riling, MD   650 mg at 04/21/18 0820  . alum & mag hydroxide-simeth (MAALOX/MYLANTA) 200-200-20 MG/5ML suspension 30 mL  30 mL Oral Q4H PRN McNew, Holly R, MD      . atorvastatin (LIPITOR) tablet 40 mg  40 mg Oral Daily McNew, Ileene Hutchinson, MD   40 mg at 04/21/18 0818  . citalopram (CELEXA) tablet 20 mg  20 mg Oral Daily McNew, Ileene Hutchinson, MD   20 mg at 04/21/18 0818  . gabapentin (NEURONTIN) tablet 900 mg  900 mg Oral TID Haskell Riling, MD   900 mg at 04/21/18 1252  . hydrOXYzine (ATARAX/VISTARIL) tablet 25 mg  25 mg Oral TID PRN Haskell Riling, MD   25 mg at 04/20/18 0852  . lamoTRIgine (LAMICTAL) tablet 75 mg  75 mg Oral BID Haskell Riling, MD   75 mg at 04/21/18 0819  . magnesium hydroxide (MILK OF MAGNESIA) suspension 30 mL  30 mL Oral Daily PRN McNew, Holly R, MD      . OLANZapine (ZYPREXA) tablet 20 mg  20 mg Oral QHS Elainah Rhyne B, MD      . traZODone (DESYREL) tablet 50 mg  50 mg Oral QHS PRN McNew, Ileene Hutchinson, MD   50 mg at 04/20/18 2228    Lab Results: No results found for this or any previous visit (from the past 48 hour(s)).  Blood Alcohol level:  Lab Results  Component Value Date   ETH <10 04/16/2018   ETH <10 09/22/2017    Metabolic Disorder Labs: Lab Results  Component Value Date   HGBA1C 5.7 (H) 04/18/2018   MPG 117 04/18/2018   No results found for: PROLACTIN Lab Results  Component Value Date   CHOL 225 (H) 04/18/2018   TRIG 77 04/18/2018   HDL 58 04/18/2018   CHOLHDL 3.9 04/18/2018   VLDL 15 04/18/2018   LDLCALC 152 (H) 04/18/2018    Physical Findings: AIMS: Facial and Oral Movements Muscles of Facial Expression: None, normal Lips and Perioral Area: None, normal Jaw: None,  normal Tongue: None, normal,Extremity Movements Upper (arms, wrists, hands, fingers): None, normal Lower (legs, knees, ankles, toes): None, normal, Trunk Movements Neck, shoulders, hips: None, normal, Overall Severity Severity of abnormal movements (  highest score from questions above): None, normal Incapacitation due to abnormal movements: None, normal Patient's awareness of abnormal movements (rate only patient's report): No Awareness, Dental Status Current problems with teeth and/or dentures?: No Does patient usually wear dentures?: No  CIWA:  CIWA-Ar Total: 3 COWS:  COWS Total Score: 0  Musculoskeletal: Strength & Muscle Tone: within normal limits Gait & Station: normal Patient leans: N/A  Psychiatric Specialty Exam: Physical Exam  Nursing note and vitals reviewed. Psychiatric: His speech is normal. His mood appears anxious. Thought content is paranoid and delusional. Cognition and memory are impaired. He expresses impulsivity.    Review of Systems  Neurological: Negative.   Psychiatric/Behavioral: Positive for depression and substance abuse. The patient is nervous/anxious.   All other systems reviewed and are negative.   Blood pressure (!) 155/85, pulse 61, temperature 97.8 F (36.6 C), temperature source Oral, resp. rate 16, height  (1.753 m), weight 93.9 kg (207 lb), SpO2 96 %.Body mass index is 30.57 kg/m.  General Appearance: Fairly Groomed  Eye Contact:  Good  Speech:  Clear and Coherent  Volume:  Normal  Mood:  Anxious  Affect:  Congruent  Thought Process:  Disorganized and Descriptions of Associations: Tangential  Orientation:  Full (Time, Place, and Person)  Thought Content:  Delusions, Obsessions and Paranoid Ideation  Suicidal Thoughts:  No  Homicidal Thoughts:  No  Memory:  Immediate;   Poor Recent;   Poor Remote;   Poor  Judgement:  Poor  Insight:  Lacking  Psychomotor Activity:  Increased  Concentration:  Concentration: Poor and Attention Span:  Poor  Recall:  Poor  Fund of Knowledge:  Fair  Language:  Fair  Akathisia:  No  Handed:  Right  AIMS (if indicated):     Assets:  Communication Skills Desire for Improvement Financial Resources/Insurance Resilience  ADL's:  Intact  Cognition:  WNL  Sleep:  Number of Hours: 7     Treatment Plan Summary: Daily contact with patient to assess and evaluate symptoms and progress in treatment and Medication management   52 yo male admitted due to paranoia and bizzare behaviors. He likely has underlying psychotic disorder (possible delusional disorder) but has also been using cocaine regularly which complicates the picture. He is paranoid and is obviously causing him distress and anxiety. He is not necessarily a danger to himself or others but is very anxious about odd connections that he is making and feeling like people are talking and listening in on him. He is open to a medication change as he is feeling sedated on Seroquel and not likely on high enough dose to target paranoia  Plan;  Psychosis -Stop Seroquel -Zyprexa was increased to 20 mg nightly   Depression -Continue Celexa 20 mg daily  Seizure disorder -Continue Lamictal 75 mg BID  Hepatitis C, new diagnosis -spoke with Dr. Norma Fredrickson from [facility], pt needs outpatient referral to [facility]  Dispo -CSW gave list of [place] houses that he will look into      Kristine Linea, MD 04/21/2018, 1:11 PM

## 2018-04-21 NOTE — Plan of Care (Signed)
Patient appears less anxious, still isolates to self but complaint with medication.

## 2018-04-21 NOTE — Progress Notes (Signed)
D: Pt denies SI/HI/AVH, affect is flat, isolates to self, minimal interaction with peers and staff. Pt is pleasant and cooperative, he appears less anxious rated depression a 5/10 on a scale 0-10.  A: Pt was offered support and encouragement. Pt was given scheduled medications. Pt was encouraged to attend groups.  15 minute checks were done for safety.  R:Pt did not attend group.  Pt is complaint with medication. Pt receptive to treatment and safety maintained on unit, will continue to monitor.

## 2018-04-21 NOTE — [facility] Group Notes (Signed)
LCSW Group Therapy Note 04/21/2018 1:15pm  Type of Therapy and Topic: Group Therapy: Feelings Around Returning Home & Establishing a Supportive Framework and Supporting Oneself When Supports Not Available  Participation Level: Active  Description of Group:  Patients first processed thoughts and feelings about upcoming discharge. These included fears of upcoming changes, lack of change, new living environments, judgements and expectations from others and overall stigma of mental health issues. The group then discussed the definition of a supportive framework, what that looks and feels like, and how do to discern it from an unhealthy non-supportive network. The group identified different types of supports as well as what to do when your family/friends are less than helpful or unavailable  Therapeutic Goals  1. Patient will identify one healthy supportive network that they can use at discharge. 2. Patient will identify one factor of a supportive framework and how to tell it from an unhealthy network. 3. Patient able to identify one coping skill to use when they do not have positive supports from others. 4. Patient will demonstrate ability to communicate their needs through discussion and/or role plays.  Summary of Patient Progress:  The patient reported he feels "good but anxious." Pt engaged during group session. As patients processed their anxiety about discharge and described healthy supports patient shared that he is ready to be discharge. He indicated he feels better and has a better direction on what he needs to after being discharge.  Patients identified at least one self-care tool they were willing to use after discharge, play video games.   Therapeutic Modalities Cognitive Behavioral Therapy Motivational Interviewing   Samora Jernberg  CUEBAS-COLON, LCSW 04/21/2018 12:51 PM

## 2018-04-22 NOTE — [facility] Counselor (Signed)
CSW was informed by [facility] that the patient was denied. They report that due to the patients delusions, he needs a facility that works with dual-diagnosis.   Johny Shears, MSW, [place], Bridget Hartshorn 04/22/2018 3:56 PM

## 2018-04-22 NOTE — [facility] Group Notes (Signed)
LCSW Group Therapy Note   04/22/2018 1:00pm   Type of Therapy and Topic:  Group Therapy:  Overcoming Obstacles   Participation Level:  Did Not Attend   Description of Group:    In this group patients will be encouraged to explore what they see as obstacles to their own wellness and recovery. They will be guided to discuss their thoughts, feelings, and behaviors related to these obstacles. The group will process together ways to cope with barriers, with attention given to specific choices patients can make. Each patient will be challenged to identify changes they are motivated to make in order to overcome their obstacles. This group will be process-oriented, with patients participating in exploration of their own experiences as well as giving and receiving support and challenge from other group members.   Therapeutic Goals: 1. Patient will identify personal and current obstacles as they relate to admission. 2. Patient will identify barriers that currently interfere with their wellness or overcoming obstacles.  3. Patient will identify feelings, thought process and behaviors related to these barriers. 4. Patient will identify two changes they are willing to make to overcome these obstacles:      Summary of Patient Progress      Therapeutic Modalities:   Cognitive Behavioral Therapy Solution Focused Therapy Motivational Interviewing Relapse Prevention Therapy  Glennon Mac, LCSW 04/22/2018 2:03 PM

## 2018-04-22 NOTE — Progress Notes (Signed)
[facility] MD Progress Note  04/22/2018 11:49 AM Scott West  MRN:  [id number] Subjective:  Pt looks much calmer today and much less paranoid and disorganized. He took a shower and hygiene is much better. He is much better able to make decisions today. He states that he is trying to "make my own decisions because sometimes other people make them for me." He does acknowledge that he often wonders if he is making the right decision. He states that he is supposed to be in court today for issues with the separation that his wife filed. He states that he has things to take care of such as putting the Zenaida Niece in his name. He eventually wants to get his own place. HE states taht he is supposed to be getting a payout from his work which will help him. HE states that he thinks he would be open to residential treatment now. He states that his daughter told him several months ago that he needs to do treatment if he wants her to be in his life. HE thinks he may be ready for this and wants to get back on his feet. When asked about the birds outside, he states, "Sometimes I do see vulchers out there but theres nothing I can do about it." He laughs about it and is much less paranoid and perseverative on this. When asked if he thinks they really are following him, he states, "I'm not really sure. I know its irrational and I have no evidence for it." He states that the Zyprexa is really helping him and feels much more calm and "my thoughts aren't racing as much. I can actually think." He denies SI or HI.   Principal Problem: Delusional disorder [facility]) Diagnosis:   Patient Active Problem List   Diagnosis Date Noted  . Delusional disorder (HCC) [F22] 04/17/2018    Priority: High  . Cannabis use disorder, moderate, dependence (HCC) [F12.20] 04/19/2018  . Tobacco use disorder [F17.200] 04/19/2018  . Cocaine use disorder, moderate, dependence (HCC) [F14.20] 04/19/2018  . Syncope [R55] 09/22/2017   Total Time spent with patient: 20  minutes  Past Psychiatric History: See H&P  Past Medical History:  Past Medical History:  Diagnosis Date  . Anxiety   . Asthma   . Psychosis [facility])     Past Surgical History:  Procedure Laterality Date  . HERNIA REPAIR     Family History:  Family History  Problem Relation Age of Onset  . Hypertension Mother   . Heart disease Father    Family Psychiatric  History: See H&P Social History:  Social History   Substance and Sexual Activity  Alcohol Use Yes   Comment: former 2007     Social History   Substance and Sexual Activity  Drug Use Yes  . Types: Marijuana   Comment: not for 1 year    Social History   Socioeconomic History  . Marital status: Married    Spouse name: Not on file  . Number of children: Not on file  . Years of education: Not on file  . Highest education level: Not on file  Occupational History  . Not on file  Social Needs  . Financial resource strain: Not on file  . Food insecurity:    Worry: Not on file    Inability: Not on file  . Transportation needs:    Medical: Not on file    Non-medical: Not on file  Tobacco Use  . Smoking status: Current Every Day Smoker  Types: E-cigarettes  . Smokeless tobacco: Never Used  Substance and Sexual Activity  . Alcohol use: Yes    Comment: former 2007  . Drug use: Yes    Types: Marijuana    Comment: not for 1 year  . Sexual activity: Not on file  Lifestyle  . Physical activity:    Days per week: Not on file    Minutes per session: Not on file  . Stress: Not on file  Relationships  . Social connections:    Talks on phone: Not on file    Gets together: Not on file    Attends religious service: Not on file    Active member of club or organization: Not on file    Attends meetings of clubs or organizations: Not on file    Relationship status: Not on file  Other Topics Concern  . Not on file  Social History Narrative  . Not on file   Additional Social History:    Pain Medications: see  mar Prescriptions: see mar Over the Counter: see mar History of alcohol / drug use?: Yes Longest period of sobriety (when/how long): unknown Negative Consequences of Use: Financial, Personal relationships   Name of Substance 2: alcohol 2 - Age of First Use: 21 2 - Amount (size/oz): varies 2 - Frequency: pt reports that he used alcohol on 04/15/18, last use prior was a month ago 2 - Duration: varies 2 - Last Use / Amount: yesterday Name of Substance 3: cocaine 3 - Age of First Use: 20 3 - Amount (size/oz): varies 3 - Frequency: pt reports that he used cocaine  on 04/15/18, last use prior was a month ago 3 - Duration: sporadic use 3 - Last Use / Amount: 04/16/15              Sleep: Good  Appetite:  Good  Current Medications: Current Facility-Administered Medications  Medication Dose Route Frequency Provider Last Rate Last Dose  . acetaminophen (TYLENOL) tablet 650 mg  650 mg Oral Q6H PRN Haskell Riling, MD   650 mg at 04/22/18 0826  . alum & mag hydroxide-simeth (MAALOX/MYLANTA) 200-200-20 MG/5ML suspension 30 mL  30 mL Oral Q4H PRN Morad Tal R, MD      . atorvastatin (LIPITOR) tablet 40 mg  40 mg Oral Daily Shene Maxfield, Ileene Hutchinson, MD   40 mg at 04/22/18 0824  . citalopram (CELEXA) tablet 20 mg  20 mg Oral Daily Ellieanna Funderburg, Ileene Hutchinson, MD   20 mg at 04/22/18 0824  . gabapentin (NEURONTIN) tablet 900 mg  900 mg Oral TID Haskell Riling, MD   900 mg at 04/22/18 1140  . hydrOXYzine (ATARAX/VISTARIL) tablet 25 mg  25 mg Oral TID PRN Haskell Riling, MD   25 mg at 04/21/18 1644  . lamoTRIgine (LAMICTAL) tablet 75 mg  75 mg Oral BID Haskell Riling, MD   75 mg at 04/22/18 0824  . magnesium hydroxide (MILK OF MAGNESIA) suspension 30 mL  30 mL Oral Daily PRN Rodd Heft R, MD      . OLANZapine (ZYPREXA) tablet 20 mg  20 mg Oral QHS Pucilowska, Jolanta B, MD   20 mg at 04/21/18 2130  . traZODone (DESYREL) tablet 50 mg  50 mg Oral QHS PRN Cleo Villamizar, Ileene Hutchinson, MD   50 mg at 04/20/18 2228    Lab Results: No  results found for this or any previous visit (from the past 48 hour(s)).  Blood Alcohol level:  Lab Results  Component  Value Date   ETH <10 04/16/2018   ETH <10 09/22/2017    Metabolic Disorder Labs: Lab Results  Component Value Date   HGBA1C 5.7 (H) 04/18/2018   MPG 117 04/18/2018   No results found for: PROLACTIN Lab Results  Component Value Date   CHOL 225 (H) 04/18/2018   TRIG 77 04/18/2018   HDL 58 04/18/2018   CHOLHDL 3.9 04/18/2018   VLDL 15 04/18/2018   LDLCALC 152 (H) 04/18/2018    Physical Findings: AIMS: Facial and Oral Movements Muscles of Facial Expression: None, normal Lips and Perioral Area: None, normal Jaw: None, normal Tongue: None, normal,Extremity Movements Upper (arms, wrists, hands, fingers): None, normal Lower (legs, knees, ankles, toes): None, normal, Trunk Movements Neck, shoulders, hips: None, normal, Overall Severity Severity of abnormal movements (highest score from questions above): None, normal Incapacitation due to abnormal movements: None, normal Patient's awareness of abnormal movements (rate only patient's report): No Awareness, Dental Status Current problems with teeth and/or dentures?: No Does patient usually wear dentures?: No  CIWA:  CIWA-Ar Total: 3 COWS:  COWS Total Score: 0  Musculoskeletal: Strength & Muscle Tone: within normal limits Gait & Station: normal Patient leans: N/A  Psychiatric Specialty Exam: Physical Exam  ROS  Blood pressure (!) 120/59, pulse 100, temperature (!) 100.4 F (38 C), temperature source Oral, resp. rate 18, height  (1.753 m), weight 93.9 kg (207 lb), SpO2 94 %.Body mass index is 30.57 kg/m.  General Appearance: Casual, showered today  Eye Contact:  Good  Speech:  Clear and Coherent  Volume:  Normal  Mood:  Euthymic  Affect:  Appropriate  Thought Process:  Coherent and Goal Directed  Orientation:  Full (Time, Place, and Person)  Thought Content:  Delusions  Suicidal Thoughts:  No   Homicidal Thoughts:  No  Memory:  Immediate;   Fair  Judgement:  Fair  Insight:  Fair  Psychomotor Activity:  Normal  Concentration:  Concentration: Fair  Recall:  [employer] of Knowledge:  Fair  Language:  Fair  Akathisia:  No      Assets:  Resilience  ADL's:  Intact  Cognition:  WNL  Sleep:  Number of Hours: 8.3     Treatment Plan Summary: 52 yo male admitted due to paranoia and bizzare behaviors. He had difficult weekend and was very disorganized. He is much better today. He appears much calmer and able to process information and make decisions. HE is much less perseverative on delusional thought content. He is very interested in seeking residential treatment for substance abuse. He is tolerating Zyprexa well and he feels it is greatly helping him with his thoughts.   Plan:  Psychotic disorder -Continue Zyprexa 20 mg qhs -prn hydroxyzine  Depression -Continue Celexa  Seizure disorder -Continue Lamictal 75 mg BID  Hepatitis C-new diagnosis -He will need follow up with [facility] clinic  Dispo He is interested in residential treatment. CSW to look into options for him.   Haskell Riling, MD 04/22/2018, 11:49 AM

## 2018-04-22 NOTE — [facility] Counselor (Signed)
CSW completed referral for [facility] for the patient and faxed it over. CSW completed referral for [facility] and faxed it to [facility] for an Authorization number.   Johny Shears, MSW, [place], LCASA 04/22/2018 2:24 PM

## 2018-04-22 NOTE — Progress Notes (Signed)
Received Scott West this AM after breakfast, he was compliant with his medications. He is smiling and stated he hasn't smiled in 2 years. He is feeling better, less depressed and anxiety at intervals. He is attending the group therapy sessions and actively verbalizing his thoughts and feelings. He feels ready for discharge, but with some anxiety. He has remained OOB in the milieu this AM. Status is unchanged this PM.

## 2018-04-23 NOTE — Progress Notes (Signed)
D- Patient alert and oriented. Patient presents in a pleasant mood on assessment stating that he "woke up a few times, but I feel rested". Patient complains of a toothache, rating his pain a "5/10" and he did request pain medication from this Clinical research associate. Patient reports to this writer that his depression is a "1/10" and his anxiety is a "4/10" because he has a "fear of the unknown". Patient denies SI, HI, AVH, at this time stating "I have ringing in my ears a lot for the past month and a half". Patient reports on his self-inventory that his goal for today is "concentration and acceptance", in which he will "meditate" in order to accomplish this goal.   A- Scheduled medications administered to patient, per MD orders. Support and encouragement provided.  Routine safety checks conducted every 15 minutes.  Patient informed to notify staff with problems or concerns.  R- No adverse drug reactions noted. Patient contracts for safety at this time. Patient compliant with medications and treatment plan. Patient receptive, calm, and cooperative. Patient interacts well with others on the unit.  Patient remains safe at this time.

## 2018-04-23 NOTE — Progress Notes (Signed)
Recreation Therapy Notes  Date: 04/23/2018  Time: 9:30 am   Location: Craft Room   Behavioral response: N/A   Intervention Topic: Self-esteem  Discussion/Intervention: Patient did not attend group.   Clinical Observations/Feedback:  Patient did not attend group.   Sasha Rueth LRT/CTRS        Shiree Altemus 04/23/2018 10:41 AM

## 2018-04-23 NOTE — [facility] Group Notes (Signed)
CSW Group Therapy Note  04/23/2018  Time:  0900  Type of Therapy and Topic: Group Therapy: Goals Group: SMART Goals    Participation Level:  Did Not Attend    Description of Group:   The purpose of a daily goals group is to assist and guide patients in setting recovery/wellness-related goals. The objective is to set goals as they relate to the crisis in which they were admitted. Patients will be using SMART goal modalities to set measurable goals. Characteristics of realistic goals will be discussed and patients will be assisted in setting and processing how one will reach their goal. Facilitator will also assist patients in applying interventions and coping skills learned in psycho-education groups to the SMART goal and process how one will achieve defined goal.    Therapeutic Goals:  -Patients will develop and document one goal related to or their crisis in which brought them into treatment.  -Patients will be guided by LCSW using SMART goal setting modality in how to set a measurable, attainable, realistic and time sensitive goal.  -Patients will process barriers in reaching goal.  -Patients will process interventions in how to overcome and successful in reaching goal.    Patient's Goal:  Pt was invited to attend group but chose not to attend. CSW will continue to encourage pt to attend group throughout their admission.     Therapeutic Modalities:  Motivational Interviewing  Cognitive Behavioral Therapy  Crisis Intervention Model  SMART goals setting  Heidi Dach, MSW, LCSW Clinical Social Worker 04/23/2018 9:40 AM

## 2018-04-23 NOTE — Progress Notes (Signed)
[facility] MD Progress Note  04/23/2018 12:56 PM Scott West  MRN:  [id number] Subjective:  Pt states taht he continues to feel better. He states, "I haven't smiled or laughed more that I have in two years." He states that his mood is much better. When asked what has helped, he states that being around people that arent triggering for him and "feeding into my thoughts." He also feels the medication change has helped a lot. He still ruminates about past connections he has made about people talking about him or knowing certain things about him. However, he is now questioning if these were really happening. He states that he is learning to cope with things he cannot control and not worry so much about it. He states, "I realized that these thoughts have been interfering with my ability to function." He states that is still interested in some sort of residential treatment program if available but would like to discharge soon. He denies SI, HI, AH, VH  Principal Problem: Delusional disorder (HCC) Diagnosis:   Patient Active Problem List   Diagnosis Date Noted  . Delusional disorder (HCC) [F22] 04/17/2018    Priority: High  . Cannabis use disorder, moderate, dependence (HCC) [F12.20] 04/19/2018  . Tobacco use disorder [F17.200] 04/19/2018  . Cocaine use disorder, moderate, dependence (HCC) [F14.20] 04/19/2018  . Syncope [R55] 09/22/2017   Total Time spent with patient: 20 minutes  Past Psychiatric History: See H&P  Past Medical History:  Past Medical History:  Diagnosis Date  . Anxiety   . Asthma   . Psychosis [facility])     Past Surgical History:  Procedure Laterality Date  . HERNIA REPAIR     Family History:  Family History  Problem Relation Age of Onset  . Hypertension Mother   . Heart disease Father    Family Psychiatric  History: See H&P Social History:  Social History   Substance and Sexual Activity  Alcohol Use Yes   Comment: former 2007     Social History   Substance and Sexual  Activity  Drug Use Yes  . Types: Marijuana   Comment: not for 1 year    Social History   Socioeconomic History  . Marital status: Married    Spouse name: Not on file  . Number of children: Not on file  . Years of education: Not on file  . Highest education level: Not on file  Occupational History  . Not on file  Social Needs  . Financial resource strain: Not on file  . Food insecurity:    Worry: Not on file    Inability: Not on file  . Transportation needs:    Medical: Not on file    Non-medical: Not on file  Tobacco Use  . Smoking status: Current Every Day Smoker    Types: E-cigarettes  . Smokeless tobacco: Never Used  Substance and Sexual Activity  . Alcohol use: Yes    Comment: former 2007  . Drug use: Yes    Types: Marijuana    Comment: not for 1 year  . Sexual activity: Not on file  Lifestyle  . Physical activity:    Days per week: Not on file    Minutes per session: Not on file  . Stress: Not on file  Relationships  . Social connections:    Talks on phone: Not on file    Gets together: Not on file    Attends religious service: Not on file    Active member of club or  organization: Not on file    Attends meetings of clubs or organizations: Not on file    Relationship status: Not on file  Other Topics Concern  . Not on file  Social History Narrative  . Not on file   Additional Social History:    Pain Medications: see mar Prescriptions: see mar Over the Counter: see mar History of alcohol / drug use?: Yes Longest period of sobriety (when/how long): unknown Negative Consequences of Use: Financial, Personal relationships   Name of Substance 2: alcohol 2 - Age of First Use: 21 2 - Amount (size/oz): varies 2 - Frequency: pt reports that he used alcohol on 04/15/18, last use prior was a month ago 2 - Duration: varies 2 - Last Use / Amount: yesterday Name of Substance 3: cocaine 3 - Age of First Use: 20 3 - Amount (size/oz): varies 3 - Frequency: pt  reports that he used cocaine  on 04/15/18, last use prior was a month ago 3 - Duration: sporadic use 3 - Last Use / Amount: 04/16/15              Sleep: Good  Appetite:  Good  Current Medications: Current Facility-Administered Medications  Medication Dose Route Frequency Provider Last Rate Last Dose  . acetaminophen (TYLENOL) tablet 650 mg  650 mg Oral Q6H PRN Haskell Riling, MD   650 mg at 04/23/18 0841  . alum & mag hydroxide-simeth (MAALOX/MYLANTA) 200-200-20 MG/5ML suspension 30 mL  30 mL Oral Q4H PRN Appolonia Ackert R, MD      . atorvastatin (LIPITOR) tablet 40 mg  40 mg Oral Daily Treasure Ochs, Ileene Hutchinson, MD   40 mg at 04/23/18 0841  . citalopram (CELEXA) tablet 20 mg  20 mg Oral Daily Iya Hamed, Ileene Hutchinson, MD   20 mg at 04/23/18 0840  . gabapentin (NEURONTIN) tablet 900 mg  900 mg Oral TID Haskell Riling, MD   900 mg at 04/23/18 1210  . hydrOXYzine (ATARAX/VISTARIL) tablet 25 mg  25 mg Oral TID PRN Haskell Riling, MD   25 mg at 04/23/18 0841  . lamoTRIgine (LAMICTAL) tablet 75 mg  75 mg Oral BID Haskell Riling, MD   75 mg at 04/23/18 0840  . magnesium hydroxide (MILK OF MAGNESIA) suspension 30 mL  30 mL Oral Daily PRN Bradlee Bridgers R, MD      . OLANZapine (ZYPREXA) tablet 20 mg  20 mg Oral QHS Pucilowska, Jolanta B, MD   20 mg at 04/22/18 2200  . traZODone (DESYREL) tablet 50 mg  50 mg Oral QHS PRN Avanthika Dehnert, Ileene Hutchinson, MD   50 mg at 04/20/18 2228    Lab Results: No results found for this or any previous visit (from the past 48 hour(s)).  Blood Alcohol level:  Lab Results  Component Value Date   ETH <10 04/16/2018   ETH <10 09/22/2017    Metabolic Disorder Labs: Lab Results  Component Value Date   HGBA1C 5.7 (H) 04/18/2018   MPG 117 04/18/2018   No results found for: PROLACTIN Lab Results  Component Value Date   CHOL 225 (H) 04/18/2018   TRIG 77 04/18/2018   HDL 58 04/18/2018   CHOLHDL 3.9 04/18/2018   VLDL 15 04/18/2018   LDLCALC 152 (H) 04/18/2018    Physical Findings: AIMS:  Facial and Oral Movements Muscles of Facial Expression: None, normal Lips and Perioral Area: None, normal Jaw: None, normal Tongue: None, normal,Extremity Movements Upper (arms, wrists, hands, fingers): None, normal Lower (legs, knees,  ankles, toes): None, normal, Trunk Movements Neck, shoulders, hips: None, normal, Overall Severity Severity of abnormal movements (highest score from questions above): None, normal Incapacitation due to abnormal movements: None, normal Patient's awareness of abnormal movements (rate only patient's report): No Awareness, Dental Status Current problems with teeth and/or dentures?: No Does patient usually wear dentures?: No  CIWA:  CIWA-Ar Total: 3 COWS:  COWS Total Score: 0  Musculoskeletal: Strength & Muscle Tone: within normal limits Gait & Station: Normal Patient leans: N/A  Psychiatric Specialty Exam: Physical Exam  Nursing note and vitals reviewed.   Review of Systems  All other systems reviewed and are negative.   Blood pressure 127/82, pulse 74, temperature 98.7 F (37.1 C), temperature source Oral, resp. rate 18, height  (1.753 m), weight 93.9 kg (207 lb), SpO2 95 %.Body mass index is 30.57 kg/m.  General Appearance: Casual  Eye Contact:  Good  Speech:  Clear and Coherent  Volume:  Normal  Mood:  Euthymic  Affect:  Appropriate, much brighter and less anxious  Thought Process:  Coherent and Goal Directed  Orientation:  Full (Time, Place, and Person)  Thought Content:  Delusions but less perseverative  Suicidal Thoughts:  No  Homicidal Thoughts:  No  Memory:  Immediate;   Fair  Judgement:  Fair  Insight:  Fair  Psychomotor Activity:  Normal  Concentration:  Concentration: Fair  Recall:  [employer] of Knowledge:  Fair  Language:  Fair  Akathisia:  No      Assets:  Resilience  ADL's:  Intact  Cognition:  WNL  Sleep:  Number of Hours: 7.15     Treatment Plan Summary: 52 yo male admitted due to delusional and paranoia.  His affect is much brighter and he is much more organized in thoughts and less anxious. He still talks about delusions but he has better insight into the fact that some of these things may have not been happening int he past. He states taht he does not really feel this way currently (I.e. Like he is being investigated (this was a past delusion). He is able to have a much more rational conversation.   Plan:  Psychosis -Continue Zyprexa 20 mg qhs  Depression -Continue Celexa 20 mg daily  Seizure disorder -Continue Lamictal 75 mg BID  Dispo -He is interested in residential treatment. CSW referred to [facility]    Haskell Riling, MD 04/23/2018, 12:56 PM

## 2018-04-23 NOTE — Progress Notes (Signed)
Patient ID: Scott West, male   DOB: 1966-07-30, 52 y.o.   MRN: [id number] Isolative to room, "I am under the weather..." Pleasant on approach, denied SI/HI/AVH. Medication compliant.

## 2018-04-23 NOTE — Progress Notes (Signed)
Patient ID: Scott West, male   DOB: 1966-03-21, 51 y.o.   MRN: [id number] Visible in the day room watching TV with peers, attended the Wrap-Up Group, complied with medications, denied SI/HI/AVH.

## 2018-04-23 NOTE — [facility] Group Notes (Signed)
[facility] Group Notes:  (Nursing/MHT/Case Management/Adjunct)  Date:  04/23/2018  Time:  10:10 PM  Type of Therapy:  Group Therapy  Participation Level:  Active  Participation Quality:  Appropriate  Affect:  Appropriate  Cognitive:  Alert  Insight:  Good  Engagement in Group:  Engaged  Modes of Intervention:  Support  Summary of Progress/Problems:  Scott West 04/23/2018, 10:10 PM

## 2018-04-23 NOTE — Plan of Care (Signed)
Patient slept for Estimated Hours of 7.15; Precautionary checks every 15 minutes for safety maintained, room free of safety hazards, patient sustains no injury or falls during this shift.  Problem: Education: Goal: Ability to state activities that reduce stress will improve Outcome: Progressing   Problem: Safety: Goal: Ability to remain free from injury will improve Outcome: Progressing   Problem: Activity: Goal: Sleeping patterns will improve Outcome: Progressing

## 2018-04-24 MED ORDER — HYDROXYZINE HCL 50 MG PO TABS: 50.0000 mg | ORAL_TABLET | Freq: Three times a day (TID) | ORAL | 1 refills | Status: DC | PRN

## 2018-04-24 MED ORDER — CITALOPRAM HYDROBROMIDE 20 MG PO TABS: 20.0000 mg | ORAL_TABLET | Freq: Every day | ORAL | 1 refills | Status: AC

## 2018-04-24 MED ORDER — CITALOPRAM HYDROBROMIDE 20 MG PO TABS: 20.0000 mg | ORAL_TABLET | Freq: Every day | ORAL | 1 refills | Status: DC

## 2018-04-24 MED ORDER — GABAPENTIN 600 MG PO TABS: 900.0000 mg | ORAL_TABLET | Freq: Three times a day (TID) | ORAL | 0 refills | Status: DC

## 2018-04-24 MED ORDER — HYDROXYZINE HCL 50 MG PO TABS: 25.0000 mg | ORAL_TABLET | Freq: Three times a day (TID) | ORAL | 1 refills | Status: DC | PRN

## 2018-04-24 MED ORDER — LAMOTRIGINE 25 MG PO TABS: 75.0000 mg | ORAL_TABLET | Freq: Two times a day (BID) | ORAL | 0 refills | Status: DC

## 2018-04-24 MED ORDER — OLANZAPINE 20 MG PO TABS: 20.0000 mg | ORAL_TABLET | Freq: Every day | ORAL | 1 refills | Status: DC

## 2018-04-24 MED ORDER — TRAZODONE HCL 50 MG PO TABS: 50.0000 mg | ORAL_TABLET | Freq: Every evening | ORAL | 0 refills | Status: AC | PRN

## 2018-04-24 MED ORDER — HYDROXYZINE HCL 50 MG PO TABS: 50.0000 mg | ORAL_TABLET | Freq: Three times a day (TID) | ORAL | 1 refills | Status: AC | PRN

## 2018-04-24 MED ORDER — TRAZODONE HCL 50 MG PO TABS: 50.0000 mg | ORAL_TABLET | Freq: Every evening | ORAL | 0 refills | Status: DC | PRN

## 2018-04-24 NOTE — [facility] Group Notes (Signed)
04/24/2018 1PM  Type of Therapy/Topic:  Group Therapy:  Emotion Regulation  Participation Level:  Did Not Attend   Description of Group:   The purpose of this group is to assist patients in learning to regulate negative emotions and experience positive emotions. Patients will be guided to discuss ways in which they have been vulnerable to their negative emotions. These vulnerabilities will be juxtaposed with experiences of positive emotions or situations, and patients will be challenged to use positive emotions to combat negative ones. Special emphasis will be placed on coping with negative emotions in conflict situations, and patients will process healthy conflict resolution skills.  Therapeutic Goals: 1. Patient will identify two positive emotions or experiences to reflect on in order to balance out negative emotions 2. Patient will label two or more emotions that they find the most difficult to experience 3. Patient will demonstrate positive conflict resolution skills through discussion and/or role plays  Summary of Patient Progress: Patient was encouraged and invited to attend group. Patient did not attend group. Social worker will continue to encourage group participation in the future.        Therapeutic Modalities:   Cognitive Behavioral Therapy Feelings Identification Dialectical Behavioral Therapy   Johny Shears, LCSW 04/24/2018 2:08 PM

## 2018-04-24 NOTE — Progress Notes (Signed)
Recreation Therapy Notes  Date: 04/24/2018  Time: 9:30 am   Location: Craft Room   Behavioral response: N/A   Intervention Topic: Creative Expression  Discussion/Intervention: Patient did not attend group.   Clinical Observations/Feedback:  Patient did not attend group.   Rylyn Zawistowski LRT/CTRS        Mccoy Testa 04/24/2018 11:56 AM

## 2018-04-24 NOTE — Plan of Care (Signed)
Remains depressed but denies SI. Reports feeling as if he needs a longer program where he is able to be discharged but continue treatment. Problem: Self-Concept: Goal: Level of anxiety will decrease Outcome: Progressing   Problem: Safety: Goal: Ability to remain free from injury will improve Outcome: Progressing   Problem: Elimination: Goal: Will not experience complications related to bowel motility Outcome: Progressing   Problem: Activity: Goal: Sleeping patterns will improve Outcome: Progressing

## 2018-04-24 NOTE — Plan of Care (Signed)
Patient verbalizes understanding of his condition as well as his discharge needs and has not voiced any further questions or concerns at this time. Patient has the ability to identify effective coping skills although he has been sleeping throughout the day because he did not sleep well last night. Patient denies SI/HI/AVH at this time but states to this writer "just ringing in my ears". Patient also denies any signs/symptoms of anxiety at this time. Patient rates his depression a "5/10" stating that it's because of the "situation with me and my wife". Patient has the ability to identify the available resources to assist him in meeting his healthcare needs. Patient has remained free from any health related complications thus far. Patient has remained free from injury thus far and remains safe on the unit at this time.   Problem: Education: Goal: Ability to state activities that reduce stress will improve Outcome: Progressing   Problem: Coping: Goal: Ability to identify and develop effective coping behavior will improve Outcome: Progressing   Problem: Self-Concept: Goal: Ability to identify factors that promote anxiety will improve Outcome: Progressing Goal: Level of anxiety will decrease Outcome: Progressing Goal: Ability to modify response to factors that promote anxiety will improve Outcome: Progressing   Problem: Education: Goal: Knowledge of disease or condition will improve Outcome: Progressing Goal: Understanding of discharge needs will improve Outcome: Progressing   Problem: Health Behavior/Discharge Planning: Goal: Ability to identify changes in lifestyle to reduce recurrence of condition will improve Outcome: Progressing Goal: Identification of resources available to assist in meeting health care needs will improve Outcome: Progressing   Problem: Safety: Goal: Ability to remain free from injury will improve Outcome: Progressing   Problem: Elimination: Goal: Will not  experience complications related to bowel motility Outcome: Progressing   Problem: Activity: Goal: Sleeping patterns will improve Outcome: Progressing

## 2018-04-24 NOTE — Progress Notes (Signed)
D- Patient alert and oriented. Patient presents in a pleasant mood on assessment stating that he didn't sleep well last night. Patient states to this writer that he has a headache and rates his pain level a "4/10" and did request some pain medication from this Clinical research associate. Patienr rates his depression a "5/10" stating that the "situation with my wife" is making him feel this way. Patient denies SI, HI, AVH, at this time. Patient also denies any signs/symptoms of anxiety at this time. Patient's goal for today is "hopefully when I talk to the doctor, she'll let me go home".    A- Scheduled medications administered to patient, per MD orders. Support and encouragement provided.  Routine safety checks conducted every 15 minutes.  Patient informed to notify staff with problems or concerns.  R- No adverse drug reactions noted. Patient contracts for safety at this time. Patient compliant with medications and treatment plan. Patient receptive, calm, and cooperative. Patient interacts well with others on the unit.  Patient remains safe at this time.

## 2018-04-24 NOTE — Progress Notes (Signed)
[facility] MD Progress Note  04/24/2018 12:23 PM Scott West  MRN:  [id number]   Subjective:  Pt states that he is feeling better. He is still looking out the window at the birds and still feeling like they are looking at him. There are birds present outside. However, he is less perseverative on this and states, "I Scott't control it though.:" He still has some delusional thought content but is still able to talk about how these connections may not be real and that he has always had this going on. He is still wanting residential treatment and promised his daughter hew would do it. He is wanting to be discharged so he Scott take care of bills and get some things taken care of. [facility] is still reviewing his chart. He states that he would be able to call and follow up with them if we do not hear back from them. He is much less anxious and affect is brighter. 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 He did not endorse SI or HI at all through hospitalization.   Principal Problem: Delusional disorder [facility]) Diagnosis:   Patient Active Problem List   Diagnosis Date Noted  . Delusional disorder (HCC) [F22] 04/17/2018    Priority: High  . Cannabis use disorder, moderate, dependence (HCC) [F12.20] 04/19/2018  . Tobacco use disorder [F17.200] 04/19/2018  . Cocaine use disorder, moderate, dependence (HCC) [F14.20] 04/19/2018  . Syncope [R55] 09/22/2017   Total Time spent with patient: 20 minutes  Past Psychiatric History: See H&P  Past Medical History:  Past Medical History:  Diagnosis Date  . Anxiety   . Asthma   . Psychosis [facility])     Past Surgical History:  Procedure Laterality Date  . HERNIA REPAIR     Family History:  Family History  Problem Relation Age of Onset  . Hypertension Mother   . Heart disease Father    Family Psychiatric  History: See H&P Social History:  Social History   Substance and Sexual Activity  Alcohol Use Yes   Comment: former 2007    Social History   Substance and Sexual Activity  Drug Use Yes  . Types: Marijuana   Comment: not for 1 year    Social History   Socioeconomic History  . Marital status: Married    Spouse name: Not on file  . Number of children: Not on file  . Years of education: Not on file  . Highest education level: Not on file  Occupational History  . Not on file  Social Needs  . Financial resource strain: Not on file  . Food insecurity:    Worry: Not on file    Inability: Not on file  . Transportation needs:    Medical: Not on file    Non-medical: Not on file  Tobacco Use  . Smoking status: Current Every Day Smoker    Types: E-cigarettes  . Smokeless tobacco: Never Used  Substance and Sexual Activity  . Alcohol use: Yes    Comment: former 2007  . Drug use: Yes    Types: Marijuana    Comment: not for 1 year  . Sexual activity: Not on file  Lifestyle  . Physical activity:    Days per week: Not on file    Minutes per session: Not on file  . Stress: Not on file  Relationships  . Social connections:    Talks on phone: Not on file    Gets  together: Not on file    Attends religious service: Not on file    Active member of club or organization: Not on file    Attends meetings of clubs or organizations: Not on file    Relationship status: Not on file  Other Topics Concern  . Not on file  Social History Narrative  . Not on file   Additional Social History:    Pain Medications: see mar Prescriptions: see mar Over the Counter: see mar History of alcohol / drug use?: Yes Longest period of sobriety (when/how long): unknown Negative Consequences of Use: Financial, Personal relationships   Name of Substance 2: alcohol 2 - Age of First Use: 21 2 - Amount (size/oz): varies 2 - Frequency: pt reports that he used alcohol on 04/15/18, last use prior was a month ago 2 - Duration: varies 2 - Last Use / Amount: yesterday Name of Substance 3: cocaine 3 - Age of First Use: 20 3 -  Amount (size/oz): varies 3 - Frequency: pt reports that he used cocaine  on 04/15/18, last use prior was a month ago 3 - Duration: sporadic use 3 - Last Use / Amount: 04/16/15              Sleep: Fair  Appetite:  Good  Current Medications: Current Facility-Administered Medications  Medication Dose Route Frequency Provider Last Rate Last Dose  . acetaminophen (TYLENOL) tablet 650 mg  650 mg Oral Q6H PRN McNew, Ileene Hutchinson, MD   650 mg at 04/24/18 0900  . alum & mag hydroxide-simeth (MAALOX/MYLANTA) 200-200-20 MG/5ML suspension 30 mL  30 mL Oral Q4H PRN McNew, Holly R, MD      . atorvastatin (LIPITOR) tablet 40 mg  40 mg Oral Daily McNew, Ileene Hutchinson, MD   40 mg at 04/24/18 0900  . citalopram (CELEXA) tablet 20 mg  20 mg Oral Daily McNew, Ileene Hutchinson, MD   20 mg at 04/24/18 0900  . gabapentin (NEURONTIN) tablet 900 mg  900 mg Oral TID Haskell Riling, MD   900 mg at 04/24/18 0900  . hydrOXYzine (ATARAX/VISTARIL) tablet 25 mg  25 mg Oral TID PRN Haskell Riling, MD   25 mg at 04/23/18 0841  . lamoTRIgine (LAMICTAL) tablet 75 mg  75 mg Oral BID McNew, Ileene Hutchinson, MD   75 mg at 04/24/18 0900  . magnesium hydroxide (MILK OF MAGNESIA) suspension 30 mL  30 mL Oral Daily PRN McNew, Holly R, MD      . OLANZapine (ZYPREXA) tablet 20 mg  20 mg Oral QHS Pucilowska, Jolanta B, MD   20 mg at 04/23/18 2130  . traZODone (DESYREL) tablet 50 mg  50 mg Oral QHS PRN McNew, Ileene Hutchinson, MD   50 mg at 04/20/18 2228    Lab Results: No results found for this or any previous visit (from the past 48 hour(s)).  Blood Alcohol level:  Lab Results  Component Value Date   ETH <10 04/16/2018   ETH <10 09/22/2017    Metabolic Disorder Labs: Lab Results  Component Value Date   HGBA1C 5.7 (H) 04/18/2018   MPG 117 04/18/2018   No results found for: PROLACTIN Lab Results  Component Value Date   CHOL 225 (H) 04/18/2018   TRIG 77 04/18/2018   HDL 58 04/18/2018   CHOLHDL 3.9 04/18/2018   VLDL 15 04/18/2018   LDLCALC 152 (H)  04/18/2018    Physical Findings: AIMS: Facial and Oral Movements Muscles of Facial Expression: None, normal Lips and  Perioral Area: None, normal Jaw: None, normal Tongue: None, normal,Extremity Movements Upper (arms, wrists, hands, fingers): None, normal Lower (legs, knees, ankles, toes): None, normal, Trunk Movements Neck, shoulders, hips: None, normal, Overall Severity Severity of abnormal movements (highest score from questions above): None, normal Incapacitation due to abnormal movements: None, normal Patient's awareness of abnormal movements (rate only patient's report): No Awareness, Dental Status Current problems with teeth and/or dentures?: No Does patient usually wear dentures?: No  CIWA:  CIWA-Ar Total: 3 COWS:  COWS Total Score: 0  Musculoskeletal: Strength & Muscle Tone: within normal limits Gait & Station: normal Patient leans: N/A  Psychiatric Specialty Exam: Physical Exam  Nursing note and vitals reviewed.   Review of Systems  All other systems reviewed and are negative.   Blood pressure 124/87, pulse 77, temperature 98.8 F (37.1 C), temperature source Oral, resp. rate 16, height  (1.753 m), weight 93.9 kg (207 lb), SpO2 95 %.Body mass index is 30.57 kg/m.  General Appearance: Casual  Eye Contact:  Fair  Speech:  Clear and Coherent  Volume:  Normal  Mood:  Euthymic  Affect:  Appropriate  Thought Process:  Coherent and Goal Directed  Orientation:  Full (Time, Place, and Person)  Thought Content:  Delusions  Suicidal Thoughts:  No  Homicidal Thoughts:  No  Memory:  Immediate;   Fair  Judgement:  Fair  Insight:  Fair  Psychomotor Activity:  Normal  Concentration:  Concentration: Fair  Recall:  Good  Fund of Knowledge:  Fair  Language:  Fair  Akathisia:  No      Assets:  Resilience  ADL's:  Intact  Cognition:  WNL  Sleep:  Number of Hours: 7     Treatment Plan Summary: 52 yo male admitted due to paranoia and delusions. He is much calmer  and able to rationally talk about delusions and think about how these connections he is making may not really be there. He is much less anxious and no longer in distress. He is learning to deal with some of these thoughts and "things I Scott't control." he realizes that drug and alcohol use are also worsening things. He is requesting discharge soon. He states that he is able to follow up with [facility] on his own if we do not hear from them today. HE has things he needs to take care of. He is much better able to have a rational conversation. There is no dangerous thought content. He will discharge home tomorrow. He states that he will either stay in his [facility] or at his office until he gets his dispersement of money.   Plan:  Delusional disorder -Continue Zyprexa 10 mg daily  Depression -Continue Celexa 20 mg daily  Seizure disorder -Lamictal 75 mg BID  New diagnosis of Hepatitis C -Will need to follow up with GI clinic  Dispo -he will discharge tomorrow. CSW has referred him to [facility]. He will be given 7 day supply as he does not have money for copay until he gets paid Haskell Riling, MD 04/24/2018, 12:23 PM

## 2018-04-24 NOTE — Progress Notes (Signed)
Patient found in day room upon my arrival. Patient is visible and social this evening. Reports needing to be discharged in order to "take care of things like bills." Reports his desire to go to a longer term program to help him with sobriety and his mental illness. Continues to report depression but is much improved as compared to admission. Much less isolative. Speech is more logical. Sleep pattern improved. Denies SI/HI/AVH. Reports eating and voiding adequately. Q 15 minute checks maintained. Will continue to monitor throughout the shift.

## 2018-04-24 NOTE — Tx Team (Signed)
Interdisciplinary Treatment and Diagnostic Plan Update  04/24/2018 Time of Session: 11am Flay Ghosh MRN: [id number]  Principal Diagnosis: Delusional disorder [facility])  Secondary Diagnoses: Principal Problem:   Delusional disorder (HCC) Active Problems:   Cannabis use disorder, moderate, dependence (HCC)   Tobacco use disorder   Cocaine use disorder, moderate, dependence (HCC)   Current Medications:  Current Facility-Administered Medications  Medication Dose Route Frequency Provider Last Rate Last Dose  . acetaminophen (TYLENOL) tablet 650 mg  650 mg Oral Q6H PRN McNew, Ileene Hutchinson, MD   650 mg at 04/24/18 0900  . alum & mag hydroxide-simeth (MAALOX/MYLANTA) 200-200-20 MG/5ML suspension 30 mL  30 mL Oral Q4H PRN McNew, Holly R, MD      . atorvastatin (LIPITOR) tablet 40 mg  40 mg Oral Daily McNew, Ileene Hutchinson, MD   40 mg at 04/24/18 0900  . citalopram (CELEXA) tablet 20 mg  20 mg Oral Daily McNew, Ileene Hutchinson, MD   20 mg at 04/24/18 0900  . gabapentin (NEURONTIN) tablet 900 mg  900 mg Oral TID Haskell Riling, MD   900 mg at 04/24/18 0900  . hydrOXYzine (ATARAX/VISTARIL) tablet 25 mg  25 mg Oral TID PRN Haskell Riling, MD   25 mg at 04/23/18 0841  . lamoTRIgine (LAMICTAL) tablet 75 mg  75 mg Oral BID McNew, Ileene Hutchinson, MD   75 mg at 04/24/18 0900  . magnesium hydroxide (MILK OF MAGNESIA) suspension 30 mL  30 mL Oral Daily PRN McNew, Holly R, MD      . OLANZapine (ZYPREXA) tablet 20 mg  20 mg Oral QHS Pucilowska, Jolanta B, MD   20 mg at 04/23/18 2130  . traZODone (DESYREL) tablet 50 mg  50 mg Oral QHS PRN McNew, Ileene Hutchinson, MD   50 mg at 04/20/18 2228   PTA Medications: Medications Prior to Admission  Medication Sig Dispense Refill Last Dose  . citalopram (CELEXA) 20 MG tablet Take 30 mg by mouth daily.   Not Taking at Unknown time  . gabapentin (NEURONTIN) 600 MG tablet Take 900 mg by mouth 3 (three) times daily.   Past Week at Unknown time  . hydrOXYzine (ATARAX/VISTARIL) 25 MG tablet Take 25 mg by  mouth 3 (three) times daily as needed for itching.    PRN at PRN  . lamoTRIgine (LAMICTAL) 25 MG tablet Take 75 mg by mouth 2 (two) times daily.  3 Past Week at Unknown time  . QUEtiapine (SEROQUEL XR) 400 MG 24 hr tablet Take 400 mg by mouth at bedtime.   Past Week at Unknown time  . ranitidine (ZANTAC) 150 MG tablet Take 150 mg by mouth as needed for heartburn.   prn at prn    Patient Stressors: Financial difficulties Marital or family conflict  Patient Strengths: General fund of knowledge Motivation for treatment/growth Religious Affiliation Supportive family/friends  Treatment Modalities: Medication Management, Group therapy, Case management,  1 to 1 session with clinician, Psychoeducation, Recreational therapy.   Physician Treatment Plan for Primary Diagnosis: Delusional disorder [facility]) Long Term Goal(s): Improvement in symptoms so as ready for discharge   Short Term Goals: Ability to verbalize feelings will improve  Medication Management: Evaluate patient's response, side effects, and tolerance of medication regimen.  Therapeutic Interventions: 1 to 1 sessions, Unit Group sessions and Medication administration.  Evaluation of Outcomes: Progressing  Physician Treatment Plan for Secondary Diagnosis: Principal Problem:   Delusional disorder (HCC) Active Problems:   Cannabis use disorder, moderate, dependence (HCC)   Tobacco use disorder  Cocaine use disorder, moderate, dependence (HCC)  Long Term Goal(s): Improvement in symptoms so as ready for discharge   Short Term Goals: Ability to verbalize feelings will improve     Medication Management: Evaluate patient's response, side effects, and tolerance of medication regimen.  Therapeutic Interventions: 1 to 1 sessions, Unit Group sessions and Medication administration.  Evaluation of Outcomes: Progressing   RN Treatment Plan for Primary Diagnosis: Delusional disorder (HCC) Long Term Goal(s): Knowledge of disease and  therapeutic regimen to maintain health will improve  Short Term Goals: Ability to participate in decision making will improve, Ability to disclose and discuss suicidal ideas, Ability to identify and develop effective coping behaviors will improve and Compliance with prescribed medications will improve  Medication Management: RN will administer medications as ordered by provider, will assess and evaluate patient's response and provide education to patient for prescribed medication. RN will report any adverse and/or side effects to prescribing provider.  Therapeutic Interventions: 1 on 1 counseling sessions, Psychoeducation, Medication administration, Evaluate responses to treatment, Monitor vital signs and CBGs as ordered, Perform/monitor CIWA, COWS, AIMS and Fall Risk screenings as ordered, Perform wound care treatments as ordered.  Evaluation of Outcomes: Progressing   LCSW Treatment Plan for Primary Diagnosis: Delusional disorder [facility]) Long Term Goal(s): Safe transition to appropriate next level of care at discharge, Engage patient in therapeutic group addressing interpersonal concerns.  Short Term Goals: Engage patient in aftercare planning with referrals and resources, Increase social support, Facilitate acceptance of mental health diagnosis and concerns, Identify triggers associated with mental health/substance abuse issues and Increase skills for wellness and recovery  Therapeutic Interventions: Assess for all discharge needs, 1 to 1 time with Social worker, Explore available resources and support systems, Assess for adequacy in community support network, Educate family and significant other(s) on suicide prevention, Complete Psychosocial Assessment, Interpersonal group therapy.  Evaluation of Outcomes: Progressing   Progress in Treatment: Attending groups: Yes. Participating in groups: Yes. Taking medication as prescribed: Yes. Toleration medication: Yes. Family/Significant other  contact made: No, will contact:  Patient refused Patient understands diagnosis: Yes. Discussing patient identified problems/goals with staff: Yes. Medical problems stabilized or resolved: Yes. Denies suicidal/homicidal ideation: Yes. Issues/concerns per patient self-inventory: No. Other:   New problem(s) identified: No, Describe:  None  New Short Term/Long Term Goal(s): "To get from day to day, get caught up on my bills and to speak with my daughter."  Discharge Plan or Barriers: To discharge and follow up with outpatient treatment.  Reason for Continuation of Hospitalization: Medication stabilization  Estimated Length of Stay:1 day  Recreational Therapy: Patient Stressors: Friends Patient Goal: Patient will successfully identify 2 ways of making healthy decisions post d/c within 5 recreation therapy group sessions  Attendees: Patient: 04/24/2018 11:21 AM  Physician: Corinna Gab, MD 04/24/2018 11:21 AM  Nursing: Leonia Reader, RN 04/24/2018 11:21 AM  RN Care Manager: 04/24/2018 11:21 AM  Social Worker: Johny Shears, LCSWA 04/24/2018 11:21 AM  Recreational Therapist: Danella Deis. Dreama Saa, LRT 04/24/2018 11:21 AM  Other: Heidi Dach, LCSW 04/24/2018 11:21 AM  Other: Jake Shark LCSW 04/24/2018 11:21 AM  Other: 04/24/2018 11:21 AM    Scribe for Treatment Team: Johny Shears, LCSW 04/24/2018 11:21 AM

## 2018-04-24 NOTE — [facility] Group Notes (Signed)
  LCSW Group Therapy Note  04/23/2018 1:00pm  Type of Therapy/Topic:  Group Therapy:  Feelings about Diagnosis  Participation Level:  Did Not Attend   Description of Group:   This group will allow patients to explore their thoughts and feelings about diagnoses they have received. Patients will be guided to explore their level of understanding and acceptance of these diagnoses. Facilitator will encourage patients to process their thoughts and feelings about the reactions of others to their diagnosis and will guide patients in identifying ways to discuss their diagnosis with significant others in their lives. This group will be process-oriented, with patients participating in exploration of their own experiences, giving and receiving support, and processing challenge from other group members.   Therapeutic Goals: 1. Patient will demonstrate understanding of diagnosis as evidenced by identifying two or more symptoms of the disorder 2. Patient will be able to express two feelings regarding the diagnosis 3. Patient will demonstrate their ability to communicate their needs through discussion and/or role play  Summary of Patient Progress:       Therapeutic Modalities:   Cognitive Behavioral Therapy Brief Therapy Feelings Identification    Mylinda Brook P Kelii Chittum, LCSW 04/24/2018 4:01 PM   

## 2018-04-24 NOTE — Plan of Care (Signed)
Patient slept for Estimated Hours of 7; Precautionary checks every 15 minutes for safety maintained, room free of safety hazards, patient sustains no injury or falls during this shift.  Problem: Education: Goal: Ability to state activities that reduce stress will improve Outcome: Progressing   Problem: Education: Goal: Understanding of discharge needs will improve Outcome: Progressing   Problem: Safety: Goal: Ability to remain free from injury will improve Outcome: Progressing   Problem: Activity: Goal: Sleeping patterns will improve Outcome: Progressing

## 2018-04-25 MED ORDER — GABAPENTIN 600 MG PO TABS: 900.0000 mg | ORAL_TABLET | Freq: Three times a day (TID) | ORAL | 0 refills | Status: AC

## 2018-04-25 MED ORDER — LAMOTRIGINE 25 MG PO TABS: 75.0000 mg | ORAL_TABLET | Freq: Two times a day (BID) | ORAL | 1 refills | Status: AC

## 2018-04-25 NOTE — Progress Notes (Signed)
  [facility] Adult Case Management Discharge Plan :  Will you be returning to the same living situation after discharge:  Yes,  Shelter/ information on [facility] [employer] At discharge, do you have transportation home?: Yes,  Patient drove himself. Do you have the ability to pay for your medications: Yes,  Insurance  Release of information consent forms completed and in the chart;  Patient's signature needed at discharge.  Patient to Follow up at: Follow-up Information    [facility] Follow up.   Why:  Follow up wtih Gastroenterlogy for new diagnosis of hepatitis  C Contact information: [address] [place] [address] [phone]        [facility] Go on 04/26/2018.   Why:  Please follow up with Unk Pinto for Peer support services on Friday 04/26/2018 at 7am. Please meet him at 7am. His number is [phone]. Thank you. Contact information: [address] [place] [address] [phone]        [facility] Follow up.   Specialty:  Behavioral Health Why:  CSW will call you if you have been accepted with an appointment time. Thank you. Contact information: [address] [place] [address] [phone]          Next level of care provider has access to [facility] Link:no  Safety Planning and Suicide Prevention discussed: Yes,  Completed with patient  Have you used any form of tobacco in the last 30 days? (Cigarettes, Smokeless Tobacco, Cigars, and/or Pipes): No  Has patient been referred to the Quitline?: N/A patient is not a smoker  Patient has been referred for addiction treatment: Yes  Johny Shears, LCSW 04/25/2018, 8:44 AM

## 2018-04-25 NOTE — BHH Suicide Risk Assessment (Signed)
[facility] Discharge Suicide Risk Assessment   Principal Problem: Delusional disorder [facility]) Discharge Diagnoses:  Patient Active Problem List   Diagnosis Date Noted  . Delusional disorder (HCC) [F22] 04/17/2018    Priority: High  . Cannabis use disorder, moderate, dependence (HCC) [F12.20] 04/19/2018  . Tobacco use disorder [F17.200] 04/19/2018  . Cocaine use disorder, moderate, dependence (HCC) [F14.20] 04/19/2018  . Syncope [R55] 09/22/2017    Mental Status Per Nursing Assessment::   On Admission:  NA  Demographic Factors:  Male, Caucasian and Living alone  Loss Factors: NA  Historical Factors: NA  Risk Reduction Factors:   Employed, Positive therapeutic relationship and Positive coping skills or problem solving skills  Continued Clinical Symptoms:  Delusional  Cognitive Features That Contribute To Risk:  None    Suicide Risk:  Minimal: No identifiable suicidal ideation.   Follow-up Information    [facility] Follow up.   Why:  Follow up wtih Gastroenterlogy for new diagnosis of hepatitis  C Contact information: [address] [place] [address] [phone]        [facility] Go on 04/26/2018.   Why:  Please follow up with Unk Pinto for Peer support services on Friday 04/26/2018 at 7am. Please meet him at 7am. His number is [phone]. Thank you. Contact information: [address] [place] [address] [phone]        [facility] Follow up.   Specialty:  Behavioral Health Why:  CSW will call you if you have been accepted with an appointment time. Thank you. Contact information: [address] [place] [address] [phone]          Plan Of Care/Follow-up recommendations: Follow up with [facility]. Also make appointment with [facility] for follow up for Hepatitis diagnosis.   Haskell Riling, MD 04/25/2018, 11:56 AM

## 2018-04-25 NOTE — Progress Notes (Signed)
Recreation Therapy Notes  INPATIENT RECREATION TR PLAN  Patient Details Name: Scott West MRN: [id number] DOB: [birth year] Today's Date: 04/25/2018  Rec Therapy Plan Is patient appropriate for Therapeutic Recreation?: Yes Treatment times per week: at least 3 Estimated Length of Stay: 5-7 days TR Treatment/Interventions: Group participation (Comment)  Discharge Criteria Pt will be discharged from therapy if:: Discharged Treatment plan/goals/alternatives discussed and agreed upon by:: Patient/family  Discharge Summary Short term goals set: Patient will successfully identify 2 ways of making healthy decisions post d/c within 5 recreation therapy group sessions Short term goals met: Not met Reason goals not met: Patient did not attend any groups Therapeutic equipment acquired: N/A Reason patient discharged from therapy: Discharge from hospital Pt/family agrees with progress & goals achieved: Yes Date patient discharged from therapy: 04/25/18   Clover Feehan 04/25/2018, 1:29 PM

## 2018-04-25 NOTE — BHH Group Notes (Signed)
BHH Group Notes:  (Nursing/MHT/Case Management/Adjunct)  Date:  04/25/2018  Time:  1:05 AM  Type of Therapy:  Group Therapy  Participation Level:  Active  Participation Quality:  Appropriate  Affect:  Appropriate  Cognitive:  Appropriate  Insight:  Appropriate  Engagement in Group:  Engaged  Modes of Intervention:  Discussion  Summary of Progress/Problems:  Scott West 04/25/2018, 1:05 AM

## 2018-04-25 NOTE — BHH Group Notes (Signed)
  04/25/2018  Time: 1PM  Type of Therapy/Topic:  Group Therapy:  Balance in Life  Participation Level:  Active  Description of Group:   This group will address the concept of balance and how it feels and looks when one is unbalanced. Patients will be encouraged to process areas in their lives that are out of balance and identify reasons for remaining unbalanced. Facilitators will guide patients in utilizing problem-solving interventions to address and correct the stressor making their life unbalanced. Understanding and applying boundaries will be explored and addressed for obtaining and maintaining a balanced life. Patients will be encouraged to explore ways to assertively make their unbalanced needs known to significant others in their lives, using other group members and facilitator for support and feedback.  Therapeutic Goals: 1. Patient will identify two or more emotions or situations they have that consume much of in their lives. 2. Patient will identify signs/triggers that life has become out of balance:  3. Patient will identify two ways to set boundaries in order to achieve balance in their lives:  4. Patient will demonstrate ability to communicate their needs through discussion and/or role plays  Summary of Patient Progress: Pt continues to work towards their tx goals but has not yet reached them. Pt was able to appropriately participate in group discussion, and was able to offer support/validation to other group members. Pt reported feeling, "okay. I'm feeling pretty good and hopeful because I'm going home today. I have new tools and resources I hope to use." Pt reported, "I've neglected every area of my life. I want to focus on being functional with my mental health, and then the other areas will fall into place."   Therapeutic Modalities:   Cognitive Behavioral Therapy Solution-Focused Therapy Assertiveness Training  Heidi Dach, MSW, LCSW Clinical Social Worker 04/25/2018 1:54  PM

## 2018-04-25 NOTE — Progress Notes (Signed)
Recreation Therapy Notes  Date: 04/25/2018  Time: 9:30 am   Location: Craft Room   Behavioral response: N/A   Intervention Topic: Animal Assisted Therapy  Discussion/Intervention: Patient did not attend group.   Clinical Observations/Feedback:  Patient did not attend group.   Adeli Frost LRT/CTRS         Daytona Hedman 04/25/2018 10:18 AM

## 2018-04-25 NOTE — Discharge Summary (Signed)
Physician Discharge Summary Note  Patient:  Scott West is an 52 y.o., male MRN:  [id number] DOB:  [birth year] Patient phone:  [phone] (home)  Patient address:   [address] [place] [address],  Total Time spent with patient: 20 minutes Plus 20 minutes of medication reconciliation, discharge planning, and discharge documentation  Date of Admission:  04/17/2018 Date of Discharge: 04/25/18  Reason for Admission:  Delusions, paranoia  Principal Problem: Delusional disorder [facility]) Discharge Diagnoses: Patient Active Problem List   Diagnosis Date Noted  . Delusional disorder (HCC) [F22] 04/17/2018    Priority: High  . Cannabis use disorder, moderate, dependence (HCC) [F12.20] 04/19/2018  . Tobacco use disorder [F17.200] 04/19/2018  . Cocaine use disorder, moderate, dependence (HCC) [F14.20] 04/19/2018  . Syncope [R55] 09/22/2017    Past Psychiatric History: See H&P  Past Medical History:  Past Medical History:  Diagnosis Date  . Anxiety   . Asthma   . Psychosis [facility])     Past Surgical History:  Procedure Laterality Date  . HERNIA REPAIR     Family History:  Family History  Problem Relation Age of Onset  . Hypertension Mother   . Heart disease Father    Family Psychiatric  History: See H&P Social History:  Social History   Substance and Sexual Activity  Alcohol Use Yes   Comment: former 2007     Social History   Substance and Sexual Activity  Drug Use Yes  . Types: Marijuana   Comment: not for 1 year    Social History   Socioeconomic History  . Marital status: Married    Spouse name: Not on file  . Number of children: Not on file  . Years of education: Not on file  . Highest education level: Not on file  Occupational History  . Not on file  Social Needs  . Financial resource strain: Not on file  . Food insecurity:    Worry: Not on file    Inability: Not on file  . Transportation needs:    Medical: Not on file    Non-medical: Not on  file  Tobacco Use  . Smoking status: Current Every Day Smoker    Types: E-cigarettes  . Smokeless tobacco: Never Used  Substance and Sexual Activity  . Alcohol use: Yes    Comment: former 2007  . Drug use: Yes    Types: Marijuana    Comment: not for 1 year  . Sexual activity: Not on file  Lifestyle  . Physical activity:    Days per week: Not on file    Minutes per session: Not on file  . Stress: Not on file  Relationships  . Social connections:    Talks on phone: Not on file    Gets together: Not on file    Attends religious service: Not on file    Active member of club or organization: Not on file    Attends meetings of clubs or organizations: Not on file    Relationship status: Not on file  Other Topics Concern  . Not on file  Social History Narrative  . Not on file    Hospital Course:  Pt was initially restarted on Seroquel XR. However, he continued to exhibit paranoia and delusions and was feeling too sedated on this medication. This was switched to Zyprexa and titrated up to 20 mg. He was also restarted on Celexa 20 mg daily. He is also on Lamictal for seizure disorder. Paranoia and anxiety improved significantly  on Zyprexa. He still has some baseline delusions but is much less perseverative on them. On day of discharge, he was much more organized and able to make decisions. He is able to have a much more rational conversation and is in much less distress. He did not mention any delusional thought content today. He wants to discharge so he can get his dispersement from his job and possible get his own apartment. HE also has bills to pay. He is still interested in [facility]. The referral was made but they are still reviewing this. He states that he is capable to following up on this on his own. He also plans to stay in [place] and will likely see Lorella Nimrod with [facility] tomorrow. HE was given 7 day supply of his medications as he does not have money for a copay at this time. He denied SI  and HI through hospitalization. His hygiene was much better today and he took a shower.   Pt was found to be Hepatitis C positive which is a new diagnosis for him. He was informed of this and any questions he had were discussed. Let him know to avoid sharing of any needles.   He will need to follow up with [facility]. He voiced understanding of this and is capable of calling to set up appointment. He was given number for clinic   The patient is at low risk of imminent suicide. Patient denied thoughts, intent, or plan for harm to self or others, expressed significant future orientation, and expressed an ability to mobilize assistance for his needs. He is presently void of any contributing psychiatric symptoms, cognitive difficulties, or substance use which would elevate his risk for lethality. Chronic risk for lethality is elevated in light of male gender, substance abuse, delusions. The chronic risk is presently mitigated by his ongoing desire and engagement in [facility] treatment and mobilization of support from family and friends. Chronic risk may elevate if he experiences any significant loss or worsening of symptoms, which can be managed and monitored through outpatient providers. At this time,a cute risk for lethality is low and he is stable for ongoing outpatient management.   Modifiable risk factors were addressed during this hospitalization through appropriate pharmacotherapy and establishment of outpatient follow-up treatment. Some risk factors for suicide are situational (i.e. Unstable housing) or related personality pathology (i.e. Poor coping mechanisms) and thus cannot be further mitigated by continued hospitalization in this setting.    Physical Findings: AIMS: Facial and Oral Movements Muscles of Facial Expression: None, normal Lips and Perioral Area: None, normal Jaw: None, normal Tongue: None, normal,Extremity Movements Upper (arms, wrists, hands, fingers): None, normal Lower (legs,  knees, ankles, toes): None, normal, Trunk Movements Neck, shoulders, hips: None, normal, Overall Severity Severity of abnormal movements (highest score from questions above): None, normal Incapacitation due to abnormal movements: None, normal Patient's awareness of abnormal movements (rate only patient's report): No Awareness, Dental Status Current problems with teeth and/or dentures?: No Does patient usually wear dentures?: No  CIWA:  CIWA-Ar Total: 3 COWS:  COWS Total Score: 0  Musculoskeletal: Strength & Muscle Tone: within normal limits Gait & Station: normal Patient leans: N/A  Psychiatric Specialty Exam: Physical Exam  Nursing note and vitals reviewed.   Review of Systems  All other systems reviewed and are negative.   Blood pressure 123/75, pulse (!) 57, temperature 97.9 F (36.6 C), temperature source Oral, resp. rate 16, height  (1.753 m), weight 93.9 kg (207 lb), SpO2 96 %.Body  mass index is 30.57 kg/m.  General Appearance: Casual  Eye Contact:  Good  Speech:  Blocked  Volume:  Normal  Mood:  Euthymic  Affect:  Appropriate  Thought Process:  Coherent and Goal Directed  Orientation:  Full (Time, Place, and Person)  Thought Content:  Delusions but much less perseverative on these  Suicidal Thoughts:  No  Homicidal Thoughts:  No  Memory:  Immediate;   Fair  Judgement:  Fair  Insight:  Fair  Psychomotor Activity:  Normal  Concentration:  Concentration: Good  Recall:  Fair  Fund of Knowledge:  Fair  Language:  Fair  Akathisia:  No      Assets:  Resilience  ADL's:  Intact  Cognition:  WNL  Sleep:  Number of Hours: 7.5     Have you used any form of tobacco in the last 30 days? (Cigarettes, Smokeless Tobacco, Cigars, and/or Pipes): No  Has this patient used any form of tobacco in the last 30 days? (Cigarettes, Smokeless Tobacco, Cigars, and/or Pipes) No  Blood Alcohol level:  Lab Results  Component Value Date   ETH <10 04/16/2018   ETH <10  09/22/2017    Metabolic Disorder Labs:  Lab Results  Component Value Date   HGBA1C 5.7 (H) 04/18/2018   MPG 117 04/18/2018   No results found for: PROLACTIN Lab Results  Component Value Date   CHOL 225 (H) 04/18/2018   TRIG 77 04/18/2018   HDL 58 04/18/2018   CHOLHDL 3.9 04/18/2018   VLDL 15 04/18/2018   LDLCALC 152 (H) 04/18/2018    See Psychiatric Specialty Exam and Suicide Risk Assessment completed by Attending Physician prior to discharge.  Discharge destination:  Self Care  Is patient on multiple antipsychotic therapies at discharge:  No   Has Patient had three or more failed trials of antipsychotic monotherapy by history:  No  Recommended Plan for Multiple Antipsychotic Therapies: NA   Allergies as of 04/25/2018      Reactions   Brethine [terbutaline]    Brompheniramine Hives   Dimetapp Decongestant [pseudoephedrine]       Medication List    STOP taking these medications   QUEtiapine 400 MG 24 hr tablet Commonly known as:  SEROQUEL XR     TAKE these medications     Indication  citalopram 20 MG tablet Commonly known as:  CELEXA Take 1 tablet (20 mg total) by mouth daily. What changed:  how much to take  Indication:  Depression   gabapentin 600 MG tablet Commonly known as:  NEURONTIN Take 1.5 tablets (900 mg total) by mouth 3 (three) times daily.  Indication:  Neuropathy   hydrOXYzine 50 MG tablet Commonly known as:  ATARAX/VISTARIL Take 1 tablet (50 mg total) by mouth 3 (three) times daily as needed for anxiety. What changed:    medication strength  how much to take  reasons to take this  Indication:  Feeling Anxious   lamoTRIgine 25 MG tablet Commonly known as:  LAMICTAL Take 3 tablets (75 mg total) by mouth 2 (two) times daily.  Indication:  Seizures   OLANZapine 20 MG tablet Commonly known as:  ZYPREXA Take 1 tablet (20 mg total) by mouth at bedtime.  Indication:  Psychosis   ranitidine 150 MG tablet Commonly known as:   ZANTAC Take 150 mg by mouth as needed for heartburn.  Indication:  Gastroesophageal Reflux Disease   traZODone 50 MG tablet Commonly known as:  DESYREL Take 1 tablet (50 mg total) by mouth at  bedtime as needed for sleep.  Indication:  Trouble Sleeping      Follow-up Information    [facility] Follow up.   Why:  Follow up wtih Gastroenterlogy for new diagnosis of hepatitis  C Contact information: [address] [place] [address] [phone]        [facility] Go on 04/26/2018.   Why:  Please follow up with Unk Pinto for Peer support services on Friday 04/26/2018 at 7am. Please meet him at 7am. His number is [phone]. Thank you. Contact information: [address] [place] [address] [phone]        [facility] Follow up.   Specialty:  Behavioral Health Why:  CSW will call you if you have been accepted with an appointment time. Thank you. Contact information: [address] [place] [address] [phone]          Follow-up recommendations:  Follow up with [facility], call [facility] to check on Referral made for residential treatment Signed: Haskell Riling, MD 04/25/2018, 11:57 AM

## 2018-04-25 NOTE — BHH Group Notes (Signed)
LCSW Group Therapy Note 04/25/2018 9:00 AM  Type of Therapy and Topic:  Group Therapy:  Setting Goals  Participation Level:  Active  Description of Group: In this process group, patients discussed using strengths to work toward goals and address challenges.  Patients identified two positive things about themselves and one goal they were working on.  Patients were given the opportunity to share openly and support each other's plan for self-empowerment.  The group discussed the value of gratitude and were encouraged to have a daily reflection of positive characteristics or circumstances.  Patients were encouraged to identify a plan to utilize their strengths to work on current challenges and goals.  Therapeutic Goals 1. Patient will verbalize personal strengths/positive qualities and relate how these can assist with achieving desired personal goals 2. Patients will verbalize affirmation of peers plans for personal change and goal setting 3. Patients will explore the value of gratitude and positive focus as related to successful achievement of goals 4. Patients will verbalize a plan for regular reinforcement of personal positive qualities and circumstances.  Summary of Patient Progress:  Scott West actively participated in today's group on setting goals using the SMART model.  Scott West engage in an extensive discussion on his goal of going into a SA residential tx program which will help him to achieve his goal of being sober and "staying off drugs".  Scott West shared that he also would like to work on being more focused and can achieve this by work on his "irrational fear and being more rational in his thinking".  Scott West shared that when he is done with his SA tx he would like to get more involved in positive activities in his community or "stay busy".  Scott West shared that he knows that he can be successful in achieving many of his goals because he has already been applying principles of 12-Steps to his life  (spirituality).     Therapeutic Modalities Cognitive Behavioral Therapy Motivational Interviewing    Alease Frame, [place] 04/25/2018 10:08 AM

## 2020-11-26 ENCOUNTER — Encounter (HOSPITAL_COMMUNITY): Payer: Self-pay

## 2020-11-26 ENCOUNTER — Other Ambulatory Visit: Payer: Self-pay

## 2020-11-26 ENCOUNTER — Ambulatory Visit (HOSPITAL_COMMUNITY)
Admission: EM | Admit: 2020-11-26 | Discharge: 2020-11-26 | Disposition: A | Discharge status: Home / Self Care | Payer: PRIVATE HEALTH INSURANCE | Attending: Family Medicine | Admitting: Family Medicine

## 2020-11-26 DIAGNOSIS — U071 COVID-19: Secondary | ICD-10-CM | POA: Diagnosis not present

## 2020-11-26 DIAGNOSIS — J4521 Mild intermittent asthma with (acute) exacerbation: Secondary | ICD-10-CM | POA: Insufficient documentation

## 2020-11-26 DIAGNOSIS — J45909 Unspecified asthma, uncomplicated: Secondary | ICD-10-CM

## 2020-11-26 MED ORDER — ALBUTEROL SULFATE HFA 108 (90 BASE) MCG/ACT IN AERS: 1.0000 | INHALATION_SPRAY | Freq: Four times a day (QID) | RESPIRATORY_TRACT | 0 refills | Status: AC | PRN

## 2020-11-26 MED ORDER — PREDNISONE 20 MG PO TABS: 20.0000 mg | ORAL_TABLET | Freq: Two times a day (BID) | ORAL | 0 refills | Status: AC

## 2020-11-26 NOTE — Discharge Instructions (Signed)
Check My Chart for COVID 19 test Take the prednisone 2 x a day for 5 days Use inhaler as needed wheezing

## 2020-11-26 NOTE — ED Provider Notes (Signed)
[facility]    CSN: [id number] Arrival date & time: 11/26/20  1206      History   Chief Complaint Chief Complaint  Patient presents with  . Cough  . loss of taste/smell  . covid positive    HPI Scott West is a 54 y.o. male.   HPI  Patient states he has had symptoms for 2-week.  He has had runny stuffy nose, cough, shortness of breath.  He had shaking chills with teeth chattering but did not take his temperature.  He did a home test and he is Covid positive.  His mother is positive for Covid as well Patient states that he has a history of asthma.  He is an active cigarette smoker.  He has been out of work for 2 weeks because he is still short of breath.  He assumes it is from the Covid.  He has not had any fever since early on in the illness History of mental illness.  Lives with mother, works as a Armed forces logistics/support/administrative officer  Past Medical History:  Diagnosis Date  . Anxiety   . Asthma   . Psychosis [facility])     Patient Active Problem List   Diagnosis Date Noted  . Asthma, chronic 11/26/2020  . Cannabis use disorder, moderate, dependence (HCC) 04/19/2018  . Tobacco use disorder 04/19/2018  . Cocaine use disorder, moderate, dependence (HCC) 04/19/2018  . Delusional disorder (HCC) 04/17/2018  . Syncope 09/22/2017    Past Surgical History:  Procedure Laterality Date  . HERNIA REPAIR         Home Medications    Prior to Admission medications   Medication Sig Start Date End Date Taking? Authorizing Provider  ARIPiprazole (ABILIFY) 10 MG tablet Take by mouth. 11/11/20  Yes [provider]  citalopram (CELEXA) 20 MG tablet Take 1 tablet (20 mg total) by mouth daily. 04/24/18  Yes McNew, Ileene Hutchinson, MD  lamoTRIgine (LAMICTAL) 100 MG tablet Take 100 mg by mouth 2 (two) times daily. 11/11/20  Yes [provider]  traZODone (DESYREL) 50 MG tablet Take 1 tablet (50 mg total) by mouth at bedtime as needed for sleep. 04/24/18  Yes McNew, Ileene Hutchinson, MD   albuterol (VENTOLIN HFA) 108 (90 Base) MCG/ACT inhaler Inhale 1-2 puffs into the lungs every 6 (six) hours as needed for wheezing or shortness of breath. 11/26/20   Eustace Moore, MD  busPIRone (BUSPAR) 10 MG tablet Take 10 mg by mouth 3 (three) times daily. 11/11/20   [provider]  gabapentin (NEURONTIN) 600 MG tablet Take 1.5 tablets (900 mg total) by mouth 3 (three) times daily. 04/25/18 05/25/18  McNew, Ileene Hutchinson, MD  hydrOXYzine (ATARAX/VISTARIL) 50 MG tablet Take 1 tablet (50 mg total) by mouth 3 (three) times daily as needed for anxiety. 04/24/18   McNew, Ileene Hutchinson, MD  lamoTRIgine (LAMICTAL) 25 MG tablet Take 3 tablets (75 mg total) by mouth 2 (two) times daily. 04/25/18 05/25/18  McNew, Ileene Hutchinson, MD  predniSONE (DELTASONE) 20 MG tablet Take 1 tablet (20 mg total) by mouth 2 (two) times daily with a meal. 11/26/20   Eustace Moore, MD  ranitidine (ZANTAC) 150 MG tablet Take 150 mg by mouth as needed for heartburn.    [provider]  OLANZapine (ZYPREXA) 20 MG tablet Take 1 tablet (20 mg total) by mouth at bedtime. 04/24/18 11/26/20  McNew, Ileene Hutchinson, MD    Family History Family History  Problem Relation Age of Onset  . Hypertension  Mother   . Heart disease Father     Social History Social History   Tobacco Use  . Smoking status: Current Every Day Smoker    Types: E-cigarettes  . Smokeless tobacco: Never Used  Substance Use Topics  . Alcohol use: Yes    Comment: former 2007  . Drug use: Yes    Types: Marijuana    Comment: not for 1 year     Allergies   Brompheniramine, Dimetapp decongestant [pseudoephedrine], Other, and Terbutaline   Review of Systems Review of Systems  See HPI Physical Exam Triage Vital Signs ED Triage Vitals  Enc Vitals Group     BP 11/26/20 1338 (!) 144/112     Pulse Rate 11/26/20 1338 83     Resp 11/26/20 1338 20     Temp 11/26/20 1338 98.9 F (37.2 C)     Temp Source 11/26/20 1338 Oral     SpO2 11/26/20 1338 100 %      Weight 11/26/20 1333 270 lb (122.5 kg)     Height --      Head Circumference --      Peak Flow --      Pain Score 11/26/20 1327 1     Pain Loc --      Pain Edu? --      Excl. in GC? --    No data found.  Updated Vital Signs BP (!) 147/90 (BP Location: Right Arm) Comment (BP Location): large cuff for re-eval  Pulse 83   Temp 98.9 F (37.2 C) (Oral)   Resp 20   Wt 122.5 kg   SpO2 100%   BMI 39.87 kg/m   Physical Exam Constitutional:      General: He is not in acute distress.    Appearance: He is well-developed and well-nourished.     Comments: Overweight.  No acute distress  HENT:     Head: Normocephalic and atraumatic.     Right Ear: Tympanic membrane and ear canal normal.     Left Ear: Tympanic membrane and ear canal normal.     Nose: Nose normal.     Mouth/Throat:     Mouth: Oropharynx is clear and moist.     Pharynx: No posterior oropharyngeal erythema.  Eyes:     Conjunctiva/sclera: Conjunctivae normal.     Pupils: Pupils are equal, round, and reactive to light.  Cardiovascular:     Rate and Rhythm: Normal rate and regular rhythm.     Heart sounds: Normal heart sounds.  Pulmonary:     Effort: No respiratory distress.     Breath sounds: Wheezing and rhonchi present.     Comments: Scattered inspiratory wheeze.  Few anterior rhonchi.  No rales Abdominal:     General: There is no distension.     Palpations: Abdomen is soft.  Musculoskeletal:        General: No edema. Normal range of motion.     Cervical back: Normal range of motion and neck supple.  Skin:    General: Skin is warm and dry.  Neurological:     Mental Status: He is alert.  Psychiatric:        Behavior: Behavior normal.      UC Treatments / Results  Labs (all labs ordered are listed, but only abnormal results are displayed) Labs Reviewed  SARS CORONAVIRUS 2 (TAT 6-24 HRS)    EKG   Radiology No results found.  Procedures Procedures (including critical care time)  Medications  Ordered in UC  Medications - No data to display  Initial Impression / Assessment and Plan / UC Course  I have reviewed the triage vital signs and the nursing notes.  Pertinent labs & imaging results that were available during my care of the patient were reviewed by me and considered in my medical decision making (see chart for details).     Reviewed with this patient he likely has an asthma exacerbation causing his shortness of breath.  I believe his Covid is likely resolving.  He is requesting a new Covid test today.  I recommend he not be tested, as it may continue to be positive for months after his infection.  He is released to go back to work on Monday. Final Clinical Impressions(s) / UC Diagnoses   Final diagnoses:  Mild intermittent asthma with acute exacerbation  COVID-19     Discharge Instructions     Check My Chart for COVID 19 test Take the prednisone 2 x a day for 5 days Use inhaler as needed wheezing   ED Prescriptions    Medication Sig Dispense Auth. Provider   albuterol (VENTOLIN HFA) 108 (90 Base) MCG/ACT inhaler Inhale 1-2 puffs into the lungs every 6 (six) hours as needed for wheezing or shortness of breath. 18 g Eustace Moore, MD   predniSONE (DELTASONE) 20 MG tablet Take 1 tablet (20 mg total) by mouth 2 (two) times daily with a meal. 10 tablet Eustace Moore, MD     PDMP not reviewed this encounter.   Eustace Moore, MD 11/26/20 [phone]

## 2020-11-27 LAB — SARS CORONAVIRUS 2 (TAT 6-24 HRS): SARS Coronavirus 2: POSITIVE — AB

## 2020-12-01 ENCOUNTER — Encounter (HOSPITAL_COMMUNITY): Payer: Self-pay | Admitting: Emergency Medicine

## 2020-12-01 ENCOUNTER — Other Ambulatory Visit: Payer: Self-pay

## 2020-12-01 ENCOUNTER — Ambulatory Visit (INDEPENDENT_AMBULATORY_CARE_PROVIDER_SITE_OTHER): Payer: PRIVATE HEALTH INSURANCE

## 2020-12-01 ENCOUNTER — Telehealth (HOSPITAL_COMMUNITY): Payer: Self-pay | Admitting: Emergency Medicine

## 2020-12-01 ENCOUNTER — Ambulatory Visit (HOSPITAL_COMMUNITY)
Admission: EM | Admit: 2020-12-01 | Discharge: 2020-12-01 | Disposition: A | Discharge status: Home / Self Care | Payer: PRIVATE HEALTH INSURANCE | Attending: Family Medicine | Admitting: Family Medicine

## 2020-12-01 DIAGNOSIS — U071 COVID-19: Secondary | ICD-10-CM | POA: Diagnosis not present

## 2020-12-01 DIAGNOSIS — R0602 Shortness of breath: Secondary | ICD-10-CM

## 2020-12-01 MED ORDER — BENZONATATE 100 MG PO CAPS: ORAL_CAPSULE | ORAL | 0 refills | Status: AC

## 2020-12-01 MED ORDER — BENZONATATE 100 MG PO CAPS: 200.0000 mg | ORAL_CAPSULE | Freq: Three times a day (TID) | ORAL | 0 refills | Status: DC | PRN

## 2020-12-01 NOTE — ED Triage Notes (Signed)
PT C/O: cold sx onset 2 days .... Reports he tested positive for COVID 11/26/20  Sx also include fatigue  Seen here last week for similar sx but needs note for work.   DENIES: v/n/d  TAKING MEDS: Albuterol inhaler, steroids   A&O x4... NAD... Ambulatory

## 2020-12-01 NOTE — ED Provider Notes (Signed)
[facility]   [id number] 12/01/20 Arrival Time: 4268  ASSESSMENT & PLAN:  1. COVID-19 virus infection     I have personally viewed the imaging studies ordered this visit. Slight bilateral patchy changes consistent with early COVID PNA. Discussed with pt. No resp distress/trouble. Comfortable with home observation.  Meds ordered this encounter  Medications  . benzonatate (TESSALON) 100 MG capsule    Sig: Take 1 capsule by mouth every 8 (eight) hours for cough.    Dispense:  21 capsule    Refill:  0      Reviewed expectations re: course of current medical issues. Questions answered. Outlined signs and symptoms indicating need for more acute intervention. Understanding verbalized. After Visit Summary given.   SUBJECTIVE: History from: patient. Scott West is a 54 y.o. male who was dx with COVID this past week. Reports fatigue and fever. Occas SOB with exertion. No CP. "Just feel bad". Normal PO intake without n/v/d.    OBJECTIVE:  Vitals:   12/01/20 1143  BP: (!) 157/90  Pulse: 98  Resp: 20  Temp: 97.9 F (36.6 C)  TempSrc: Oral  SpO2: 96%    General appearance: alert; no distress; appears fatigued Eyes: PERRLA; EOMI; conjunctiva normal  HENT: [place]; AT; with nasal congestion Neck: supple  Lungs: speaks full sentences without difficulty; unlabored; occas cough Extremities: no edema Skin: warm and dry Neurologic: normal gait Psychological: alert and cooperative; normal mood and affect  Labs: Results for orders placed or performed during the hospital encounter of 11/26/20  SARS CORONAVIRUS 2 (TAT 6-24 HRS) Nasopharyngeal Nasopharyngeal Swab   Specimen: Nasopharyngeal Swab  Result Value Ref Range   SARS Coronavirus 2 POSITIVE (A) NEGATIVE    Imaging: DG Chest 2 View  Result Date: 12/01/2020 CLINICAL DATA:  COVID positive.  Shortness of breath. EXAM: CHEST - 2 VIEW COMPARISON:  None. FINDINGS: Subtle patchy peripheral predominant interstitial  and airspace opacities. No visible pleural effusions or pneumothorax. Cardiomediastinal silhouette is within normal limits. Aortic atherosclerosis. No acute osseous abnormality. IMPRESSION: Subtle patchy peripheral prominent interstitial and airspace opacities, compatible with multifocal pneumonia and the reported COVID diagnosis. Electronically Signed   By: Feliberto Harts MD   On: 12/01/2020 12:29    Allergies  Allergen Reactions  . Brompheniramine Hives  . Dimetapp Decongestant [Pseudoephedrine]   . Other     Oranges-ulcers in mouth   . Terbutaline     hallucinations    Past Medical History:  Diagnosis Date  . Anxiety   . Asthma   . Psychosis (HCC)    Social History   Socioeconomic History  . Marital status: Married    Spouse name: Not on file  . Number of children: Not on file  . Years of education: Not on file  . Highest education level: Not on file  Occupational History  . Not on file  Tobacco Use  . Smoking status: Current Every Day Smoker    Types: E-cigarettes  . Smokeless tobacco: Never Used  Substance and Sexual Activity  . Alcohol use: Yes    Comment: former 2007  . Drug use: Yes    Types: Marijuana    Comment: not for 1 year  . Sexual activity: Not on file  Other Topics Concern  . Not on file  Social History Narrative  . Not on file   Social Determinants of Health   Financial Resource Strain: Not on file  Food Insecurity: Not on file  Transportation Needs: Not on file  Physical Activity:  Not on file  Stress: Not on file  Social Connections: Not on file  Intimate Partner Violence: Not on file   Family History  Problem Relation Age of Onset  . Hypertension Mother   . Heart disease Father    Past Surgical History:  Procedure Laterality Date  . HERNIA REPAIR       Mardella Layman, MD 12/01/20 1328

## 2022-03-25 IMAGING — DX DG CHEST 2V
2 series · 2 of 2 positions shown · non-contrast
Comparison: None.

CLINICAL DATA: COVID positive.  Shortness of breath.

EXAM:
CHEST - 2 VIEW

[chest pa]
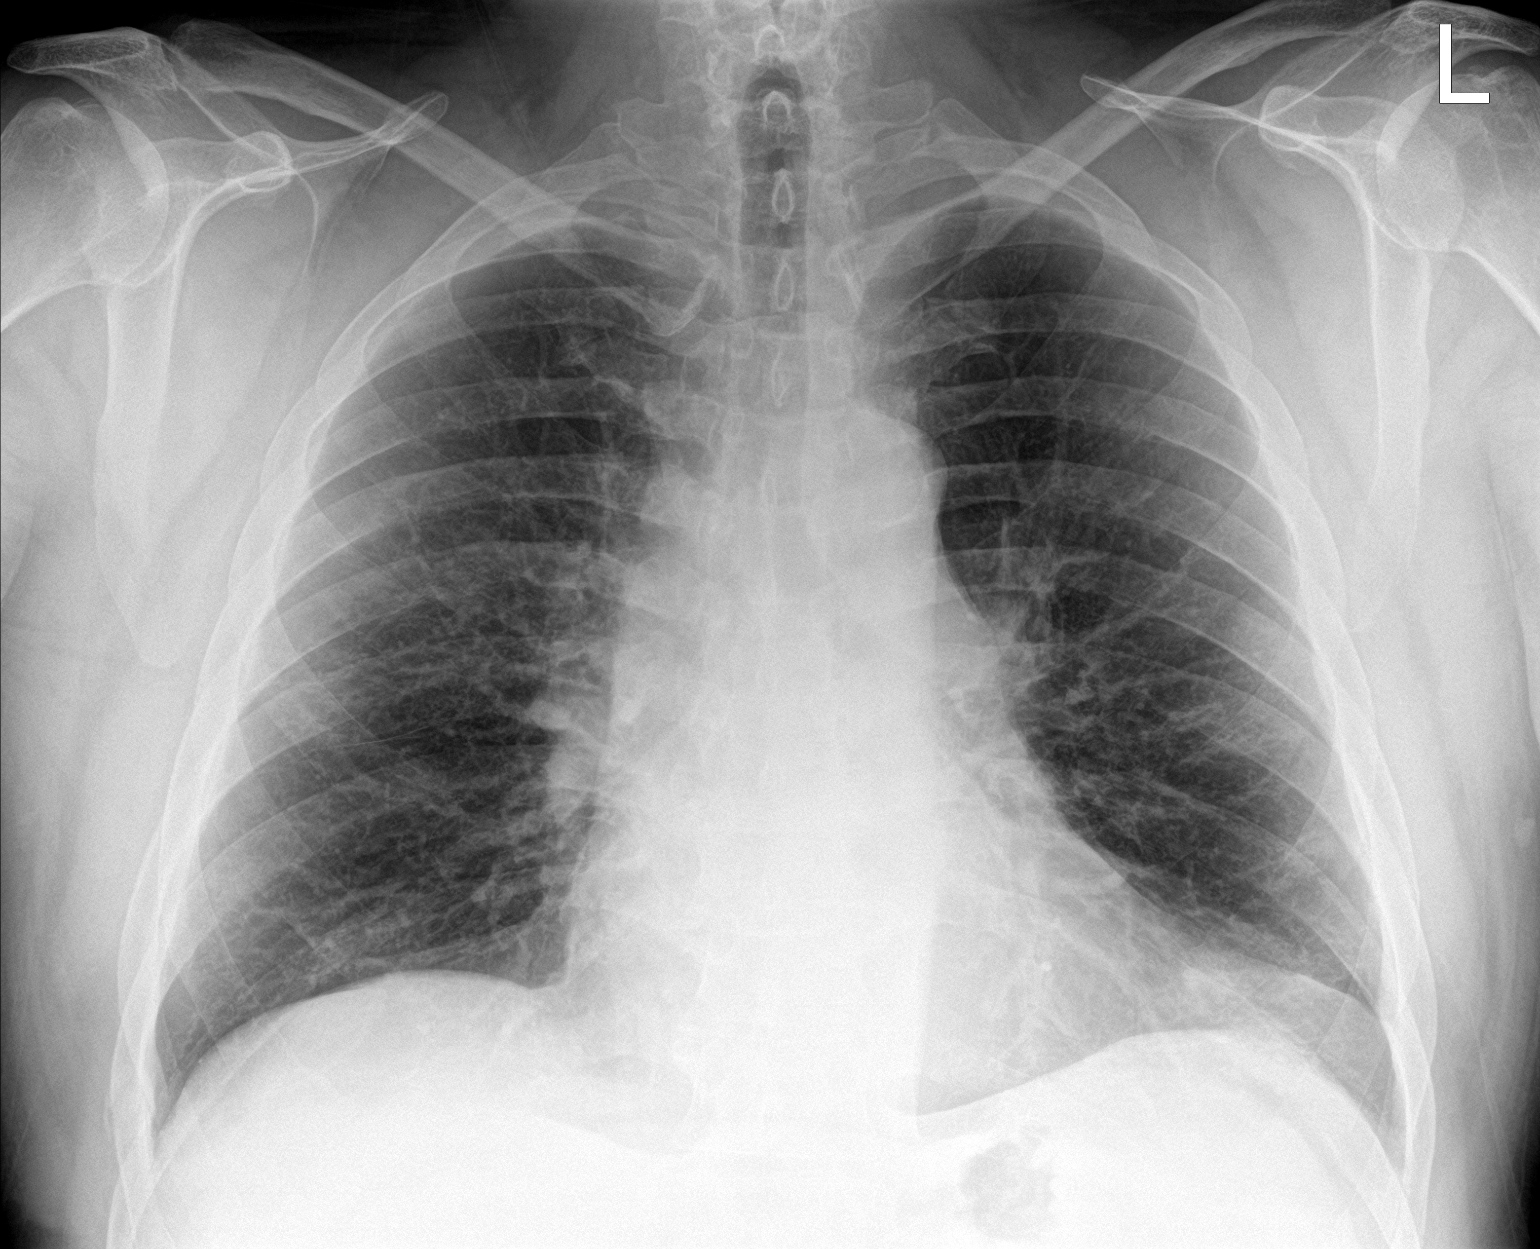

[chest lat]
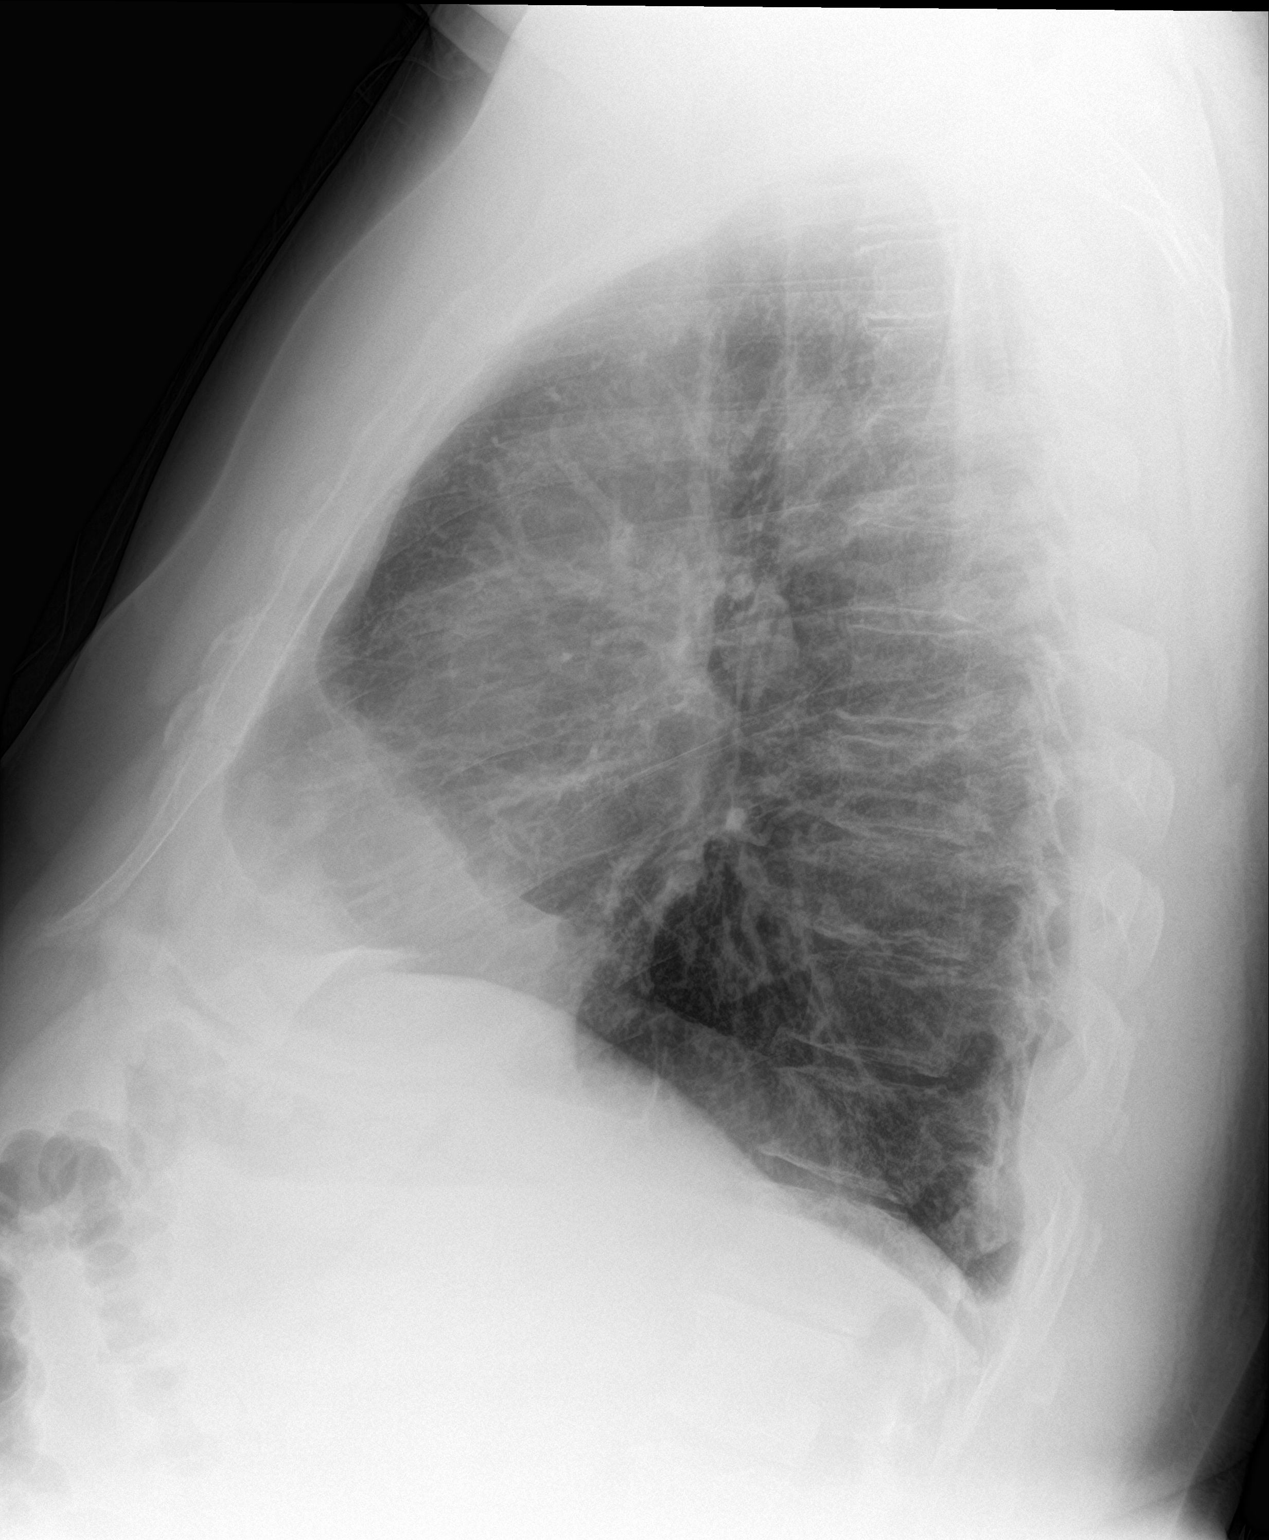

[2 of 2 positions shown; findings below may reference images not displayed]

FINDINGS: Subtle patchy peripheral predominant interstitial and airspace
opacities. No visible pleural effusions or pneumothorax.
Cardiomediastinal silhouette is within normal limits. Aortic
atherosclerosis. No acute osseous abnormality.
IMPRESSION: Subtle patchy peripheral prominent interstitial and airspace
opacities, compatible with multifocal pneumonia and the reported
COVID diagnosis.
